# Patient Record
Sex: Male | Born: 1955
Health system: Southern US, Community
[De-identification: ages and names within clinical notes are randomized; demographics above are authoritative.]

## PROBLEM LIST (undated history)

## (undated) DIAGNOSIS — F17200 Nicotine dependence, unspecified, uncomplicated: Secondary | ICD-10-CM

## (undated) DIAGNOSIS — M1990 Unspecified osteoarthritis, unspecified site: Secondary | ICD-10-CM

## (undated) DIAGNOSIS — F1910 Other psychoactive substance abuse, uncomplicated: Secondary | ICD-10-CM

## (undated) HISTORY — DX: Other psychoactive substance abuse, uncomplicated: F19.10

## (undated) HISTORY — DX: Unspecified osteoarthritis, unspecified site: M19.90

---
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
## 2005-03-03 ENCOUNTER — Ambulatory Visit
Admission: RE | Admit: 2005-03-03 | Disposition: A | Discharge status: Home / Self Care | Payer: Self-pay | Source: Ambulatory Visit | Admitting: Family Medicine

## 2017-10-24 ENCOUNTER — Encounter (RURAL_HEALTH_CENTER): Payer: Self-pay | Admitting: Family Medicine

## 2017-10-24 ENCOUNTER — Ambulatory Visit: Payer: Self-pay | Attending: Family Medicine | Admitting: Family Medicine

## 2017-10-24 VITALS — BP 123/73 | HR 60 | Temp 97.6°F | Resp 16 | Ht 71.38 in | Wt 191.4 lb

## 2017-10-24 DIAGNOSIS — H66003 Acute suppurative otitis media without spontaneous rupture of ear drum, bilateral: Secondary | ICD-10-CM

## 2017-10-24 DIAGNOSIS — J069 Acute upper respiratory infection, unspecified: Secondary | ICD-10-CM

## 2017-10-24 MED ORDER — AMOXICILLIN-POT CLAVULANATE 875-125 MG PO TABS
1.00 | ORAL_TABLET | Freq: Two times a day (BID) | ORAL | 0 refills | Status: AC
Start: 2017-10-24 — End: 2017-11-03

## 2017-10-24 NOTE — Progress Notes (Signed)
[facility]  [facility] Acute Visit    History of Presenting Illness:     Chief Complaint   Patient presents with   . Establish Care     pain in both ears and they are stopped up       Douglas Hancock. is a 61 y.o. male who presents to the office with concern for Bilateral otalgia. He tells me that he has had an upper respiratory infection since Sunday, approximately 8 days ago. She reports that his illness began with some nasal congestion as well as a dry nonproductive cough. He tells me that his cough is improving. [place] 4 days ago he has been battling bilateral ear otalgia. He tells me that he believes he may have an ear infection. He tells me he had one back in March and it felt similarly. Patient denies presence of any acute fever, chills, or body aches.  Denies presence of any GI symptoms including nausea, vomiting, abdominal pain, or diarrhea.     Originally, the patient was put on my schedule for an establish care appointment. Although, the patient tells me that he is not interested in any health maintenance. He declines vaccines today including shingles and influenza as well as a referral for screening colonoscopy. The patient tells me that he is in overall, "healthy person". He is not interested in any fasting lab work today to look for possible chronic conditions. He prefers to not go to the doctor if at all possible.     Review of Systems:   ROS  Pertinent positives and negatives as per HPI    Past Medical History:     Past Medical History:   Diagnosis Date   . Arthritis    . Substance abuse        Past Surgical History:     Past Surgical History:   Procedure Laterality Date   . TONSILLECTOMY  1965       Family History:     Family History   Problem Relation Age of Onset   . Cancer Mother    . Cancer Father        Social History:     History   Smoking Status   . Current Every Day Smoker   . Packs/day: 0.50   Smokeless Tobacco   . Never Used     History   Alcohol Use   . Yes      Comment: been sober for 28 years     History   Drug Use     Comment: in the past       Allergies:   No Known Allergies    Medications:     Prior to Admission medications    Medication Sig Start Date End Date Taking? Authorizing Provider   amoxicillin-clavulanate (AUGMENTIN) 875-125 MG per tablet Take 1 tablet by mouth 2 (two) times daily.for 10 days 10/24/17 11/03/17  Philipp Deputy, NP       Physical Exam:     Vitals:    10/24/17 1046   BP: 123/73   Pulse: 60   Resp: 16   Temp: 97.6 F (36.4 C)   SpO2: 100%     Body mass index is 26.41 kg/m.    Physical Exam   Constitutional: He is oriented to person, place, and time. He appears well-developed and well-nourished. No distress.   HENT:   Head: Normocephalic and atraumatic.   Right Ear: Hearing, external ear and ear canal normal.  Tympanic membrane is erythematous. Tympanic membrane is not retracted and not bulging. A middle ear effusion (Purulent) is present.   Left Ear: Hearing, external ear and ear canal normal. Tympanic membrane is erythematous. Tympanic membrane is not retracted and not bulging. A middle ear effusion (Purulent) is present.   Nose: Mucosal edema and rhinorrhea present. Right sinus exhibits no maxillary sinus tenderness and no frontal sinus tenderness. Left sinus exhibits no maxillary sinus tenderness and no frontal sinus tenderness.   Mouth/Throat: Uvula is midline and mucous membranes are normal. Posterior oropharyngeal erythema present. No oropharyngeal exudate or posterior oropharyngeal edema.   Eyes: Pupils are equal, round, and reactive to light. Conjunctivae and EOM are normal.   Wearing glasses.   Neck: Normal range of motion. Neck supple. No thyromegaly present.   Cardiovascular: Normal rate, regular rhythm, S1 normal and S2 normal.    No murmur heard.  Pulmonary/Chest: Effort normal and breath sounds normal. He has no decreased breath sounds. He has no wheezes. He has no rhonchi. He has no rales.   Abdominal: Soft. Bowel sounds are  normal. He exhibits no distension.   Musculoskeletal: Normal range of motion. He exhibits no edema.   Lymphadenopathy:     He has no cervical adenopathy.   Neurological: He is alert and oriented to person, place, and time.   Skin: Skin is warm and dry. No rash noted.   Psychiatric: He has a normal mood and affect. His behavior is normal. Judgment and thought content normal.   Nursing note and vitals reviewed.      Assessment:     1. Acute suppurative otitis media of both ears without spontaneous rupture of tympanic membranes, recurrence not specified     2. URI with cough and congestion         Plan:   1. Augmentin 875-125 mg twice a day 10 days.  2. Also advised patient to start using over-the-counter Flonase daily   3. Push fluids  4. Tylenol and Ibuprofen as needed for fever and pain.  5. Patient is not interested in labs or addressing any health maintenance today.  6. Return to clinic if symptoms persist or worsen.     Signed by Alinda Deem, FNP-C    This note was completed using Dragon Medical speech recognition software. Grammatical errors, random word insertions, pronoun errors and incomplete sentences are occasional consequences of this technology due to software limitations. If there are questions or concerns about the content of this note or information contained within the body of this dictation they should be addressed with the provider for clarification.

## 2017-10-24 NOTE — Patient Instructions (Signed)
Middle Ear Infection (Adult)  You have an infection of the middle ear, the space behind the eardrum. This is also called acute otitis media (AOM). Sometimes it is caused by the common cold. This is because congestion can block the internal passage (eustachian tube) that drains fluid from the middle ear. When the middle ear fills with fluid, bacteria can grow there and cause an infection. Oral antibiotics are used to treat this illness, not ear drops. Symptoms usually start to improve within 1 to 2 days of treatment.    Home care  The following are general care guidelines:   Finish all of the antibiotic medicine given, even though you may feel better after the first few days.   You may use over-the-counter medicine, such as acetaminophen or ibuprofen, to control pain and fever, unless something else was prescribed. If you have chronic liver or kidney disease or have ever had a stomach ulcer or gastrointestinal bleeding, talk with your healthcare provider before using these medicines. Do not give aspirin to anyone under 18 years of age who has a fever. It may cause severe illness or death.  Follow-up care  Follow up with your healthcare provider, or as advised, in 2 weeks if all symptoms have not gotten better, or if hearing doesn't go back to normal within 1 month.  When to seek medical advice  Call your healthcare provider right away if any of these occur:   Ear pain gets worse or does not improve after 3 days of treatment   Unusual drowsiness or confusion   Neck pain, stiff neck, or headache   Fluid or blood draining from the ear canal   Fever of 100.4F (38C) or as advised   Seizure  Date Last Reviewed: 03/26/2015   2000-2016 [employer] [address], [place] [address]. All rights reserved. This information is not intended as a substitute for professional medical care. Always follow your healthcare professional's instructions.

## 2019-07-17 ENCOUNTER — Inpatient Hospital Stay (HOSPITAL_COMMUNITY)
Admission: EM | Admit: 2019-07-17 | Discharge: 2019-07-20 | DRG: 199 | Disposition: A | Discharge status: Home / Self Care | Payer: Self-pay | Attending: Surgery | Admitting: Surgery

## 2019-07-17 ENCOUNTER — Emergency Department (HOSPITAL_COMMUNITY): Payer: Self-pay

## 2019-07-17 ENCOUNTER — Encounter (HOSPITAL_COMMUNITY): Payer: Self-pay | Admitting: *Deleted

## 2019-07-17 ENCOUNTER — Other Ambulatory Visit: Payer: Self-pay

## 2019-07-17 DIAGNOSIS — F10229 Alcohol dependence with intoxication, unspecified: Secondary | ICD-10-CM | POA: Diagnosis present

## 2019-07-17 DIAGNOSIS — Y92009 Unspecified place in unspecified non-institutional (private) residence as the place of occurrence of the external cause: Secondary | ICD-10-CM

## 2019-07-17 DIAGNOSIS — Z20828 Contact with and (suspected) exposure to other viral communicable diseases: Secondary | ICD-10-CM | POA: Diagnosis present

## 2019-07-17 DIAGNOSIS — N400 Enlarged prostate without lower urinary tract symptoms: Secondary | ICD-10-CM | POA: Diagnosis present

## 2019-07-17 DIAGNOSIS — S21112A Laceration without foreign body of left front wall of thorax without penetration into thoracic cavity, initial encounter: Secondary | ICD-10-CM

## 2019-07-17 DIAGNOSIS — S21412A Laceration without foreign body of left back wall of thorax with penetration into thoracic cavity, initial encounter: Secondary | ICD-10-CM | POA: Diagnosis present

## 2019-07-17 DIAGNOSIS — J939 Pneumothorax, unspecified: Secondary | ICD-10-CM

## 2019-07-17 DIAGNOSIS — Z4682 Encounter for fitting and adjustment of non-vascular catheter: Secondary | ICD-10-CM

## 2019-07-17 DIAGNOSIS — I700 Atherosclerosis of aorta: Secondary | ICD-10-CM | POA: Diagnosis present

## 2019-07-17 DIAGNOSIS — S272XXA Traumatic hemopneumothorax, initial encounter: Principal | ICD-10-CM | POA: Diagnosis present

## 2019-07-17 DIAGNOSIS — Y909 Presence of alcohol in blood, level not specified: Secondary | ICD-10-CM | POA: Diagnosis present

## 2019-07-17 DIAGNOSIS — K5790 Diverticulosis of intestine, part unspecified, without perforation or abscess without bleeding: Secondary | ICD-10-CM | POA: Diagnosis present

## 2019-07-17 DIAGNOSIS — J942 Hemothorax: Secondary | ICD-10-CM

## 2019-07-17 LAB — I-STAT CHEM 8, ED
BUN: 18 mg/dL (ref 8–23)
Calcium, Ion: 1.17 mmol/L (ref 1.15–1.40)
Chloride: 106 mmol/L (ref 98–111)
Creatinine, Ser: 1.2 mg/dL (ref 0.61–1.24)
Glucose, Bld: 102 mg/dL — ABNORMAL HIGH (ref 70–99)
HCT: 43 % (ref 39.0–52.0)
Hemoglobin: 14.6 g/dL (ref 13.0–17.0)
Potassium: 3.3 mmol/L — ABNORMAL LOW (ref 3.5–5.1)
Sodium: 141 mmol/L (ref 135–145)
TCO2: 19 mmol/L — ABNORMAL LOW (ref 22–32)

## 2019-07-17 LAB — COMPREHENSIVE METABOLIC PANEL
ALT: 18 U/L (ref 0–44)
AST: 16 U/L (ref 15–41)
Albumin: 3.5 g/dL (ref 3.5–5.0)
Alkaline Phosphatase: 64 U/L (ref 38–126)
Anion gap: 14 (ref 5–15)
BUN: 17 mg/dL (ref 8–23)
CO2: 19 mmol/L — ABNORMAL LOW (ref 22–32)
Calcium: 9.3 mg/dL (ref 8.9–10.3)
Chloride: 106 mmol/L (ref 98–111)
Creatinine, Ser: 1.13 mg/dL (ref 0.61–1.24)
GFR calc Af Amer: >60 mL/min (ref >60)
GFR calc non Af Amer: >60 mL/min (ref >60)
Glucose, Bld: 110 mg/dL — ABNORMAL HIGH (ref 70–99)
Potassium: 3.4 mmol/L — ABNORMAL LOW (ref 3.5–5.1)
Sodium: 139 mmol/L (ref 135–145)
Total Bilirubin: 0.5 mg/dL (ref 0.3–1.2)
Total Protein: 6 g/dL — ABNORMAL LOW (ref 6.5–8.1)

## 2019-07-17 LAB — CBC
HCT: 42.9 % (ref 39.0–52.0)
Hemoglobin: 13.8 g/dL (ref 13.0–17.0)
MCH: 31.8 pg (ref 26.0–34.0)
MCHC: 32.2 g/dL (ref 30.0–36.0)
MCV: 98.8 fL (ref 80.0–100.0)
Platelets: 296 10*3/uL (ref 150–400)
RBC: 4.34 MIL/uL (ref 4.22–5.81)
RDW: 13.2 % (ref 11.5–15.5)
WBC: 14.5 10*3/uL — ABNORMAL HIGH (ref 4.0–10.5)
nRBC: 0 % (ref 0.0–0.2)

## 2019-07-17 LAB — PROTIME-INR
INR: 1.1 (ref 0.8–1.2)
Prothrombin Time: 14.1 seconds (ref 11.4–15.2)

## 2019-07-17 LAB — SAMPLE TO BLOOD BANK

## 2019-07-17 LAB — LACTIC ACID, PLASMA: Lactic Acid, Venous: 4.8 mmol/L (ref 0.5–1.9)

## 2019-07-17 LAB — ETHANOL: Alcohol, Ethyl (B): 168 mg/dL — ABNORMAL HIGH (ref <10)

## 2019-07-17 MED ORDER — FENTANYL CITRATE (PF) 100 MCG/2ML IJ SOLN
100.0000 ug | Freq: Once | INTRAMUSCULAR | Status: DC
  Filled 2019-07-17: qty 2

## 2019-07-17 MED ORDER — HYDRALAZINE HCL 20 MG/ML IJ SOLN: 10.0000 mg | INTRAMUSCULAR | Status: DC | PRN

## 2019-07-17 MED ORDER — PANTOPRAZOLE SODIUM 40 MG IV SOLR: 40.0000 mg | Freq: Every day | INTRAVENOUS | Status: DC

## 2019-07-17 MED ORDER — METHOCARBAMOL 500 MG PO TABS
500.0000 mg | ORAL_TABLET | Freq: Four times a day (QID) | ORAL | Status: DC | PRN
  Administered 2019-07-18 – 2019-07-20 (×3): 500 mg via ORAL
  Filled 2019-07-17 (×3): qty 1

## 2019-07-17 MED ORDER — VITAMIN B-1 100 MG PO TABS
100.0000 mg | ORAL_TABLET | Freq: Every day | ORAL | Status: DC
  Administered 2019-07-18 – 2019-07-20 (×3): 100 mg via ORAL
  Filled 2019-07-17 (×3): qty 1

## 2019-07-17 MED ORDER — ONDANSETRON HCL 4 MG/2ML IJ SOLN: 4.0000 mg | Freq: Four times a day (QID) | INTRAMUSCULAR | Status: DC | PRN

## 2019-07-17 MED ORDER — LORAZEPAM 2 MG/ML IJ SOLN
1.0000 mg | INTRAMUSCULAR | Status: DC | PRN
  Administered 2019-07-18 – 2019-07-19 (×2): 1 mg via INTRAVENOUS
  Filled 2019-07-17 (×2): qty 1

## 2019-07-17 MED ORDER — BISACODYL 10 MG RE SUPP: 10.0000 mg | Freq: Every day | RECTAL | Status: DC | PRN

## 2019-07-17 MED ORDER — SODIUM CHLORIDE 0.9 % IV SOLN
INTRAVENOUS | Status: DC
  Administered 2019-07-17 – 2019-07-18 (×2): via INTRAVENOUS

## 2019-07-17 MED ORDER — IBUPROFEN 200 MG PO TABS
800.0000 mg | ORAL_TABLET | Freq: Three times a day (TID) | ORAL | Status: DC
  Administered 2019-07-18 – 2019-07-20 (×7): 800 mg via ORAL
  Filled 2019-07-17: qty 4
  Filled 2019-07-17: qty 1
  Filled 2019-07-17 (×5): qty 4

## 2019-07-17 MED ORDER — ONDANSETRON 4 MG PO TBDP: 4.0000 mg | ORAL_TABLET | Freq: Four times a day (QID) | ORAL | Status: DC | PRN

## 2019-07-17 MED ORDER — ADULT MULTIVITAMIN W/MINERALS CH
1.0000 | ORAL_TABLET | Freq: Every day | ORAL | Status: DC
  Administered 2019-07-18 – 2019-07-20 (×3): 1 via ORAL
  Filled 2019-07-17 (×3): qty 1

## 2019-07-17 MED ORDER — HYDROMORPHONE HCL 1 MG/ML IJ SOLN
0.5000 mg | INTRAMUSCULAR | Status: DC | PRN
  Administered 2019-07-17 – 2019-07-18 (×2): 0.5 mg via INTRAVENOUS
  Filled 2019-07-17 (×2): qty 1

## 2019-07-17 MED ORDER — GABAPENTIN 300 MG PO CAPS
300.0000 mg | ORAL_CAPSULE | Freq: Three times a day (TID) | ORAL | Status: DC
  Administered 2019-07-18 – 2019-07-20 (×6): 300 mg via ORAL
  Filled 2019-07-17 (×6): qty 1

## 2019-07-17 MED ORDER — DOCUSATE SODIUM 100 MG PO CAPS
100.0000 mg | ORAL_CAPSULE | Freq: Two times a day (BID) | ORAL | Status: DC
  Administered 2019-07-18 – 2019-07-20 (×5): 100 mg via ORAL
  Filled 2019-07-17 (×5): qty 1

## 2019-07-17 MED ORDER — FOLIC ACID 1 MG PO TABS
1.0000 mg | ORAL_TABLET | Freq: Every day | ORAL | Status: DC
  Administered 2019-07-18 – 2019-07-20 (×3): 1 mg via ORAL
  Filled 2019-07-17 (×4): qty 1

## 2019-07-17 MED ORDER — OXYCODONE HCL 5 MG PO TABS
5.0000 mg | ORAL_TABLET | ORAL | Status: DC | PRN
  Administered 2019-07-18: 5 mg via ORAL
  Filled 2019-07-17 (×2): qty 1

## 2019-07-17 MED ORDER — THIAMINE HCL 100 MG/ML IJ SOLN: 100.0000 mg | Freq: Every day | INTRAMUSCULAR | Status: DC

## 2019-07-17 MED ORDER — IOHEXOL 300 MG/ML  SOLN
100.0000 mL | Freq: Once | INTRAMUSCULAR | Status: AC | PRN
  Administered 2019-07-17: 21:00:00 100 mL via INTRAVENOUS

## 2019-07-17 MED ORDER — ACETAMINOPHEN 325 MG PO TABS
650.0000 mg | ORAL_TABLET | Freq: Four times a day (QID) | ORAL | Status: DC
  Administered 2019-07-17 – 2019-07-20 (×8): 650 mg via ORAL
  Filled 2019-07-17 (×8): qty 2

## 2019-07-17 MED ORDER — TETANUS-DIPHTH-ACELL PERTUSSIS 5-2.5-18.5 LF-MCG/0.5 IM SUSP
0.5000 mL | Freq: Once | INTRAMUSCULAR | Status: AC
  Administered 2019-07-17: 0.5 mL via INTRAMUSCULAR
  Filled 2019-07-17: qty 0.5

## 2019-07-17 MED ORDER — PANTOPRAZOLE SODIUM 40 MG PO TBEC
40.0000 mg | DELAYED_RELEASE_TABLET | Freq: Every day | ORAL | Status: DC
  Administered 2019-07-18 – 2019-07-20 (×3): 40 mg via ORAL
  Filled 2019-07-17 (×3): qty 1

## 2019-07-17 MED ORDER — METOPROLOL TARTRATE 5 MG/5ML IV SOLN: 5.0000 mg | Freq: Four times a day (QID) | INTRAVENOUS | Status: DC | PRN

## 2019-07-17 MED ORDER — LORAZEPAM 1 MG PO TABS: 1.0000 mg | ORAL_TABLET | ORAL | Status: DC | PRN

## 2019-07-17 NOTE — ED Notes (Signed)
Pt offered pain medication per orders; pt declined.
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
## 2019-07-18 ENCOUNTER — Observation Stay (HOSPITAL_COMMUNITY): Payer: Self-pay

## 2019-07-18 DIAGNOSIS — F17200 Nicotine dependence, unspecified, uncomplicated: Secondary | ICD-10-CM

## 2019-07-18 HISTORY — DX: Nicotine dependence, unspecified, uncomplicated: F17.200

## 2019-07-18 LAB — HIV ANTIBODY (ROUTINE TESTING W REFLEX): HIV Screen 4th Generation wRfx: NONREACTIVE

## 2019-07-18 LAB — CBC
HCT: 41.3 % (ref 39.0–52.0)
HCT: 43.1 % (ref 39.0–52.0)
Hemoglobin: 13.5 g/dL (ref 13.0–17.0)
Hemoglobin: 14.1 g/dL (ref 13.0–17.0)
MCH: 31.7 pg (ref 26.0–34.0)
MCH: 31.7 pg (ref 26.0–34.0)
MCHC: 32.7 g/dL (ref 30.0–36.0)
MCHC: 32.7 g/dL (ref 30.0–36.0)
MCV: 96.9 fL (ref 80.0–100.0)
MCV: 96.9 fL (ref 80.0–100.0)
Platelets: 266 10*3/uL (ref 150–400)
Platelets: 289 10*3/uL (ref 150–400)
RBC: 4.26 MIL/uL (ref 4.22–5.81)
RBC: 4.45 MIL/uL (ref 4.22–5.81)
RDW: 13.2 % (ref 11.5–15.5)
RDW: 13.4 % (ref 11.5–15.5)
WBC: 17.2 10*3/uL — ABNORMAL HIGH (ref 4.0–10.5)
WBC: 18.5 10*3/uL — ABNORMAL HIGH (ref 4.0–10.5)
nRBC: 0 % (ref 0.0–0.2)
nRBC: 0 % (ref 0.0–0.2)

## 2019-07-18 LAB — COMPREHENSIVE METABOLIC PANEL
ALT: 21 U/L (ref 0–44)
AST: 18 U/L (ref 15–41)
Albumin: 3.9 g/dL (ref 3.5–5.0)
Alkaline Phosphatase: 73 U/L (ref 38–126)
Anion gap: 10 (ref 5–15)
BUN: 18 mg/dL (ref 8–23)
CO2: 22 mmol/L (ref 22–32)
Calcium: 9.8 mg/dL (ref 8.9–10.3)
Chloride: 106 mmol/L (ref 98–111)
Creatinine, Ser: 0.98 mg/dL (ref 0.61–1.24)
GFR calc Af Amer: >60 mL/min (ref >60)
GFR calc non Af Amer: >60 mL/min (ref >60)
Glucose, Bld: 121 mg/dL — ABNORMAL HIGH (ref 70–99)
Potassium: 3.4 mmol/L — ABNORMAL LOW (ref 3.5–5.1)
Sodium: 138 mmol/L (ref 135–145)
Total Bilirubin: 0.5 mg/dL (ref 0.3–1.2)
Total Protein: 6.6 g/dL (ref 6.5–8.1)

## 2019-07-18 LAB — BASIC METABOLIC PANEL
Anion gap: 10 (ref 5–15)
BUN: 17 mg/dL (ref 8–23)
CO2: 23 mmol/L (ref 22–32)
Calcium: 9.4 mg/dL (ref 8.9–10.3)
Chloride: 106 mmol/L (ref 98–111)
Creatinine, Ser: 0.88 mg/dL (ref 0.61–1.24)
GFR calc Af Amer: >60 mL/min (ref >60)
GFR calc non Af Amer: >60 mL/min (ref >60)
Glucose, Bld: 133 mg/dL — ABNORMAL HIGH (ref 70–99)
Potassium: 4.1 mmol/L (ref 3.5–5.1)
Sodium: 139 mmol/L (ref 135–145)

## 2019-07-18 LAB — CDS SEROLOGY

## 2019-07-18 LAB — URINALYSIS, ROUTINE W REFLEX MICROSCOPIC
Bilirubin Urine: NEGATIVE
Glucose, UA: NEGATIVE mg/dL
Hgb urine dipstick: NEGATIVE
Ketones, ur: 5 mg/dL — AB
Leukocytes,Ua: NEGATIVE
Nitrite: NEGATIVE
Protein, ur: NEGATIVE mg/dL
Specific Gravity, Urine: >1.046 — ABNORMAL HIGH (ref 1.005–1.030)
pH: 5 (ref 5.0–8.0)

## 2019-07-18 LAB — PHOSPHORUS: Phosphorus: 2.7 mg/dL (ref 2.5–4.6)

## 2019-07-18 LAB — SARS CORONAVIRUS 2 (TAT 6-24 HRS): SARS Coronavirus 2: NEGATIVE

## 2019-07-18 LAB — MAGNESIUM: Magnesium: 2 mg/dL (ref 1.7–2.4)

## 2019-07-18 MED ORDER — OXYCODONE HCL 5 MG PO TABS
5.0000 mg | ORAL_TABLET | ORAL | Status: DC | PRN
  Administered 2019-07-18: 10 mg via ORAL
  Administered 2019-07-19: 5 mg via ORAL
  Administered 2019-07-20 (×2): 10 mg via ORAL
  Filled 2019-07-18: qty 2
  Filled 2019-07-18: qty 1
  Filled 2019-07-18 (×2): qty 2
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
## 2019-07-18 NOTE — ED Notes (Signed)
MS ordered bfast 

## 2019-07-18 NOTE — ED Notes (Signed)
Pt c/o difficulty taking a deep breath and increased pain in L side of his chest that has progressively gotten worse. VS remain the same with no significant changes, sats 95%
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
## 2019-07-19 ENCOUNTER — Inpatient Hospital Stay (HOSPITAL_COMMUNITY): Payer: Self-pay

## 2019-07-19 ENCOUNTER — Encounter (HOSPITAL_COMMUNITY): Payer: Self-pay | Admitting: *Deleted

## 2019-07-19 LAB — CBC
HCT: 35 % — ABNORMAL LOW (ref 39.0–52.0)
Hemoglobin: 11.6 g/dL — ABNORMAL LOW (ref 13.0–17.0)
MCH: 31.6 pg (ref 26.0–34.0)
MCHC: 33.1 g/dL (ref 30.0–36.0)
MCV: 95.4 fL (ref 80.0–100.0)
Platelets: 229 10*3/uL (ref 150–400)
RBC: 3.67 MIL/uL — ABNORMAL LOW (ref 4.22–5.81)
RDW: 13.3 % (ref 11.5–15.5)
WBC: 11.2 10*3/uL — ABNORMAL HIGH (ref 4.0–10.5)
nRBC: 0 % (ref 0.0–0.2)

## 2019-07-19 MED ORDER — ENOXAPARIN SODIUM 40 MG/0.4ML ~~LOC~~ SOLN
40.0000 mg | SUBCUTANEOUS | Status: DC
  Administered 2019-07-19 – 2019-07-20 (×2): 40 mg via SUBCUTANEOUS
  Filled 2019-07-19 (×2): qty 0.4
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
## 2019-07-19 NOTE — Plan of Care (Signed)
  Problem: Coping: Goal: Level of anxiety will decrease Outcome: Progressing   Problem: Clinical Measurements: Goal: Respiratory complications will improve Outcome: Progressing

## 2019-07-20 ENCOUNTER — Inpatient Hospital Stay (HOSPITAL_COMMUNITY): Payer: Self-pay

## 2019-07-20 MED ORDER — METHOCARBAMOL 500 MG PO TABS: 500.0000 mg | ORAL_TABLET | Freq: Four times a day (QID) | ORAL | 0 refills | Status: DC | PRN

## 2019-07-20 MED ORDER — OXYCODONE HCL 5 MG PO TABS: 5.0000 mg | ORAL_TABLET | Freq: Four times a day (QID) | ORAL | 0 refills | Status: DC | PRN

## 2019-07-20 NOTE — Plan of Care (Addendum)
  Problem: Clinical Measurements: Goal: Respiratory complications will improve Outcome: Progressing   Problem: Clinical Measurements: Goal: Ability to maintain clinical measurements within normal limits will improve Outcome: Progressing  Back dsg changed.  Mid Back area noted swollen, slightly red, scant amount serosanguinous drainage noted.  L flank chest tube dsg is C/D/I. Reinforced with hypofix tape. No drainage noted at the chest tube entry site.

## 2019-07-20 NOTE — Progress Notes (Signed)
A discharge packet printed and reviewed.  The patient understands discharge instructions regarding obtaining a repeat chest xray before his follow up.   Dressing to the left chest dry and intact.  Dressing to the upper left back dry and intact.  No respiratory distress noted.

## 2019-07-20 NOTE — Discharge Instructions (Signed)
PNEUMOTHORAX/HEMOTHORAX HOME INSTRUCTIONS   1. PAIN CONTROL:  1. Pain is best controlled by a usual combination of three different methods TOGETHER:  i. Ice/Heat ii. Over the counter pain medication iii. Prescription pain medication 2. You may experience some swelling and bruising in area of broken ribs. Ice packs or heating pads (30-60 minutes up to 6 times a day) will help. Use ice for the first few days to help decrease swelling and bruising, then switch to heat to help relax tight/sore spots and speed recovery. Some people prefer to use ice alone, heat alone, alternating between ice & heat. Experiment to what works for you. Swelling and bruising can take several weeks to resolve.  3. It is helpful to take an over-the-counter pain medication regularly for the first few weeks. Choose one of the following that works best for you:  i. Naproxen (Aleve, etc) Two  tabs twice a day ii. Ibuprofen (Advil, etc) Three  tabs four times a day (every meal & bedtime) iii. Acetaminophen (Tylenol, etc) 500-650mg  four times a day (every meal & bedtime) 4. A prescription for pain medication (such as oxycodone, hydrocodone, etc) may be given to you upon discharge. Take your pain medication as prescribed. DO NOT DRIVE WHILE TAKING NARCOTIC PAIN MEDICATION i. If you are having problems/concerns with the prescription medicine (does not control pain, nausea, vomiting, rash, itching, etc), please call us [phone] to see if we need to switch you to a different pain medicine that will work better for you and/or control your side effect better. ii. If you need a refill on your pain medication, please contact your pharmacy. They will contact our office to request authorization. Prescriptions will not be filled after 5 pm or on week-ends. 1. Avoid getting constipated. When taking pain medications, it is common to experience some constipation. Increasing fluid intake and taking a fiber supplement (such as  Metamucil, Citrucel, FiberCon, MiraLax, etc) 1-2 times a day regularly will usually help prevent this problem from occurring. A mild laxative (prune juice, Milk of Magnesia, MiraLax, etc) should be taken according to package directions if there are no bowel movements after 48 hours.  2. Watch out for diarrhea. If you have many loose bowel movements, simplify your diet to bland foods & liquids for a few days. Stop any stool softeners and decrease your fiber supplement. Switching to mild anti-diarrheal medications (Kayopectate, Pepto Bismol) can help. If this worsens or does not improve, please call us. 3. Chest tube site wound: you may remove the dressing from your chest tube site 3 days after the removal of your chest tube. DO NOT shower over the dressing. Once   removed, you may shower as normal. Do not submerge your wound in water for 2-3 weeks.  4. FOLLOW UP  a. Please call our office to set up or confirm an appointment for follow up for 2 weeks after discharge. You will need to get a chest xray at either [facility] or [facility]. This will be outlined in your follow up instructions. Please call [facility] at [phone] if you have any questions about follow up.  b. If you have any orthopedic or other injuries you will need to follow up as outlined in your follow up instructions.   WHEN TO CALL us [phone]:  1. Poor pain control 2. Reactions / problems with new medications (rash/itching, nausea, etc)  3. Fever over 101.5 F (38.5 C) 4. Worsening swelling or bruising 5. Redness, drainage, pain or swelling around  chest tube site 6. Worsening pain, productive cough, difficulty breathing or any other concerning symptoms  The clinic staff is available to answer your questions during regular business hours (8:30am-5pm). Please dont hesitate to call and ask to speak to one of our nurses for clinical concerns.  If you have a medical emergency, go to the nearest emergency room or call 911.    [facility] always [facility] at [facility]   [facility] [place]  [address], [address], [place], [place] [address] ?  MAIN: [phone] ? TOLL FREE: [phone] ?  FAX [phone]  [email or web address]      Information on Rib Fractures  A rib fracture is a break or crack in one of the bones of the ribs. The ribs are long, curved bones that wrap around your chest and attach to your spine and your breastbone. The ribs protect your heart, lungs, and other organs in the chest. A broken or cracked rib is often painful but is not usually serious. Most rib fractures heal on their own over time. However, rib fractures can be more serious if multiple ribs are broken or if broken ribs move out of place and push against other structures or organs. What are the causes? This condition is caused by:  Repetitive movements with high force, such as pitching a baseball or having severe coughing spells.  A direct blow to the chest, such as a sports injury, a car accident, or a fall.  Cancer that has spread to the bones, which can weaken bones and cause them to break. What are the signs or symptoms? Symptoms of this condition include:  Pain when you breathe in or cough.  Pain when someone presses on the injured area.  Feeling short of breath. How is this diagnosed? This condition is diagnosed with a physical exam and medical history. Imaging tests may also be done, such as:  Chest X-ray.  CT scan.  MRI.  Bone scan.  Chest ultrasound. How is this treated? Treatment for this condition depends on the severity of the fracture. Most rib fractures usually heal on their own in 1-3 months. Sometimes healing takes longer if there is a cough that does not stop or if there are other activities that make the injury worse (aggravating factors). While you heal, you will be given medicines to control the pain. You will also be taught  deep breathing exercises. Severe injuries may require hospitalization or surgery. Follow these instructions at home: Managing pain, stiffness, and swelling  If directed, apply ice to the injured area. ? Put ice in a plastic bag. ? Place a towel between your skin and the bag. ? Leave the ice on for 20 minutes, 2-3 times a day.  Take over-the-counter and prescription medicines only as told by your health care provider. Activity  Avoid a lot of activity and any activities or movements that cause pain. Be careful during activities and avoid bumping the injured rib.  Slowly increase your activity as told by your health care provider. General instructions  Do deep breathing exercises as told by your health care provider. This helps prevent pneumonia, which is a common complication of a broken rib. Your health care provider may instruct you to: ? Take deep breaths several times a day. ? Try to cough several times a day, holding a pillow against the injured area. ? Use a device called incentive spirometer to practice deep breathing several times a day.  Drink enough fluid to keep your urine pale yellow.  Do not wear a rib belt or binder. These restrict breathing, which can lead to pneumonia.  Keep all follow-up visits as told by your health care provider. This is important. Contact a health care provider if:  You have a fever. Get help right away if:  You have difficulty breathing or you are short of breath.  You develop a cough that does not stop, or you cough up thick or bloody sputum.  You have nausea, vomiting, or pain in your abdomen.  Your pain gets worse and medicine does not help. Summary  A rib fracture is a break or crack in one of the bones of the ribs.  A broken or cracked rib is often painful but is not usually serious.  Most rib fractures heal on their own over time.  Treatment for this condition depends on the severity of the fracture.  Avoid a lot of activity  and any activities or movements that cause pain. This information is not intended to replace advice given to you by your health care provider. Make sure you discuss any questions you have with your health care provider. Document Released: 10/11/2005 Document Revised: 01/10/2017 Document Reviewed: 01/10/2017 Elsevier Interactive Patient Education  2019 [employer]    Pneumothorax A pneumothorax is commonly called a collapsed lung. It is a condition in which air leaks from a lung and builds up between the thin layer of tissue that covers the lungs (visceral pleura) and the interior wall of the chest cavity (parietal pleura). The air gets trapped outside the lung, between the lung and the chest wall (pleural space). The air takes up space and prevents the lung from fully expanding. This condition sometimes occurs suddenly with no apparent cause. The buildup of air may be small or large. A small pneumothorax may go away on its own. A large pneumothorax will require treatment and hospitalization. What are the causes? This condition may be caused by:  Trauma and injury to the chest wall.  Surgery and other medical procedures.  A complication of an underlying lung problem, especially chronic obstructive pulmonary disease (COPD) or emphysema. Sometimes the cause of this condition is not known. What increases the risk? You are more likely to develop this condition if:  You have an underlying lung problem.  You smoke.  You are 57-71 years old, male, tall, and underweight.  You have a personal or family history of pneumothorax.  You have an eating disorder (anorexia nervosa). This condition can also happen quickly, even in people with no history of lung problems. What are the signs or symptoms? Sometimes a pneumothorax will have no symptoms. When symptoms are present, they can include:  Chest pain.  Shortness of breath.  Increased rate of breathing.  Bluish color to your lips or skin  (cyanosis). How is this diagnosed? This condition may be diagnosed by:  A medical history and physical exam.  A chest X-ray, chest CT scan, or ultrasound. How is this treated? Treatment depends on how severe your condition is. The goal of treatment is to remove the extra air and allow your lung to expand back to its normal size.  For a small pneumothorax: ? No treatment may be needed. ? Extra oxygen is sometimes used to make it go away more quickly.  For a large pneumothorax or a pneumothorax that is causing symptoms, a procedure is done to drain the air from your lungs. To do this, a health care  provider may use: ? A needle with a syringe. This is used to suck air from a pleural space where no additional leakage is taking place. ? A chest tube. This is used to suck air where there is ongoing leakage into the pleural space. The chest tube may need to remain in place for several days until the air leak has healed.  In more severe cases, surgery may be needed to repair the damage that is causing the leak.  If you have multiple pneumothorax episodes or have an air leak that will not heal, a procedure called a pleurodesis may be done. A medicine is placed in the pleural space to irritate the tissues around the lung so that the lung will stick to the chest wall, seal any leaks, and stop any buildup of air in that space. If you have an underlying lung problem, severe symptoms, or a large pneumothorax you will usually need to stay in the hospital. Follow these instructions at home: Lifestyle  Do not use any products that contain nicotine or tobacco, such as cigarettes and e-cigarettes. These are major risk factors in pneumothorax. If you need help quitting, ask your health care provider.  Do not lift anything that is heavier than 10 lb (4.5 kg), or the limit that your health care provider tells you, until he or she says that it is safe.  Avoid activities that take a lot of effort (strenuous)  for as long as told by your health care provider.  Return to your normal activities as told by your health care provider. Ask your health care provider what activities are safe for you.  Do not fly in an airplane or scuba dive until your health care provider says it is okay. General instructions  Take over-the-counter and prescription medicines only as told by your health care provider.  If a cough or pain makes it difficult for you to sleep at night, try sleeping in a semi-upright position in a recliner or by using 2 or 3 pillows.  If you had a chest tube and it was removed, ask your health care provider when you can remove the bandage (dressing). While the dressing is in place, do not allow it to get wet.  Keep all follow-up visits as told by your health care provider. This is important. Contact a health care provider if:  You cough up thick mucus (sputum) that is yellow or green in color.  You were treated with a chest tube, and you have redness, increasing pain, or discharge at the site where it was placed. Get help right away if:  You have increasing chest pain or shortness of breath.  You have a cough that will not go away.  You begin coughing up blood.  You have pain that is getting worse or is not controlled with medicines.  The site where your chest tube was located opens up.  You feel air coming out of the site where the chest tube was placed.  You have a fever or persistent symptoms for more than 2-3 days.  You have a fever and your symptoms suddenly get worse. These symptoms may represent a serious problem that is an emergency. Do not wait to see if the symptoms will go away. Get medical help right away. Call your local emergency services (911 in the [place] Do not drive yourself to the hospital. Summary  A pneumothorax, commonly called a collapsed lung, is a condition in which air leaks from a lung and gets trapped  between the lung and the chest wall (pleural  space).  The buildup of air may be small or large. A small pneumothorax may go away on its own. A large pneumothorax will require treatment and hospitalization.  Treatment for this condition depends on how severe the pneumothorax is. The goal of treatment is to remove the extra air and allow the lung to expand back to its normal size. This information is not intended to replace advice given to you by your health care provider. Make sure you discuss any questions you have with your health care provider. Document Released: 10/11/2005 Document Revised: 09/19/2017 Document Reviewed: 09/19/2017 Elsevier Interactive Patient Education  2019 [employer].
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
## 2019-08-08 ENCOUNTER — Ambulatory Visit
Admission: RE | Admit: 2019-08-08 | Discharge: 2019-08-08 | Disposition: A | Discharge status: Home / Self Care | Payer: Self-pay | Source: Ambulatory Visit | Attending: Physician Assistant | Admitting: Physician Assistant

## 2019-08-08 ENCOUNTER — Other Ambulatory Visit: Payer: Self-pay | Admitting: Physician Assistant

## 2019-08-08 DIAGNOSIS — J942 Hemothorax: Secondary | ICD-10-CM

## 2019-08-09 ENCOUNTER — Other Ambulatory Visit (HOSPITAL_COMMUNITY): Payer: Self-pay | Admitting: Surgery

## 2019-08-09 ENCOUNTER — Other Ambulatory Visit: Payer: Self-pay | Admitting: Surgery

## 2019-08-16 ENCOUNTER — Other Ambulatory Visit: Payer: Self-pay | Admitting: Surgery

## 2019-08-16 ENCOUNTER — Other Ambulatory Visit (HOSPITAL_COMMUNITY): Payer: Self-pay | Admitting: Surgery

## 2019-08-16 DIAGNOSIS — J942 Hemothorax: Secondary | ICD-10-CM

## 2019-08-17 ENCOUNTER — Other Ambulatory Visit (HOSPITAL_COMMUNITY): Payer: Self-pay

## 2019-08-27 ENCOUNTER — Other Ambulatory Visit (HOSPITAL_COMMUNITY)
Admission: RE | Admit: 2019-08-27 | Discharge: 2019-08-27 | Disposition: A | Discharge status: Home / Self Care | Payer: Self-pay | Source: Ambulatory Visit | Attending: Surgery | Admitting: Surgery

## 2019-08-27 DIAGNOSIS — Z01812 Encounter for preprocedural laboratory examination: Secondary | ICD-10-CM | POA: Insufficient documentation

## 2019-08-27 DIAGNOSIS — Z20828 Contact with and (suspected) exposure to other viral communicable diseases: Secondary | ICD-10-CM | POA: Insufficient documentation

## 2019-08-27 LAB — SARS CORONAVIRUS 2 (TAT 6-24 HRS): SARS Coronavirus 2: NEGATIVE

## 2019-08-29 ENCOUNTER — Ambulatory Visit (HOSPITAL_COMMUNITY)
Admission: RE | Admit: 2019-08-29 | Discharge: 2019-08-29 | Disposition: A | Discharge status: Home / Self Care | Payer: Self-pay | Source: Ambulatory Visit | Attending: Surgery | Admitting: Surgery

## 2019-08-29 ENCOUNTER — Encounter (HOSPITAL_COMMUNITY): Payer: Self-pay | Admitting: Student

## 2019-08-29 ENCOUNTER — Other Ambulatory Visit (HOSPITAL_COMMUNITY): Payer: Self-pay | Admitting: Surgery

## 2019-08-29 ENCOUNTER — Other Ambulatory Visit: Payer: Self-pay

## 2019-08-29 DIAGNOSIS — J942 Hemothorax: Secondary | ICD-10-CM

## 2019-08-29 HISTORY — PX: IR THORACENTESIS ASP PLEURAL SPACE W/IMG GUIDE: IMG5380

## 2019-08-29 MED ORDER — LIDOCAINE HCL 1 % IJ SOLN
INTRAMUSCULAR | Status: AC
  Filled 2019-08-29: qty 20

## 2019-08-29 MED ORDER — LIDOCAINE HCL 1 % IJ SOLN
INTRAMUSCULAR | Status: AC | PRN
  Administered 2019-08-29: 10 mL

## 2019-08-29 NOTE — Procedures (Signed)
PROCEDURE SUMMARY:  Successful US guided therapeutic left thoracentesis. Yielded 700 mL of amber fluid. Pt tolerated procedure well. No immediate complications.  Specimen was not sent for labs. CXR ordered.  EBL < 5 mL  Docia Barrier PA-C 08/29/2019 2:15 PM

## 2019-09-11 ENCOUNTER — Ambulatory Visit (HOSPITAL_COMMUNITY)
Admission: RE | Admit: 2019-09-11 | Discharge: 2019-09-11 | Disposition: A | Discharge status: Home / Self Care | Payer: Self-pay | Source: Ambulatory Visit | Attending: General Surgery | Admitting: General Surgery

## 2019-09-11 ENCOUNTER — Other Ambulatory Visit (HOSPITAL_COMMUNITY): Payer: Self-pay | Admitting: General Surgery

## 2019-09-11 ENCOUNTER — Other Ambulatory Visit: Payer: Self-pay

## 2019-09-11 DIAGNOSIS — J942 Hemothorax: Secondary | ICD-10-CM

## 2019-10-03 ENCOUNTER — Ambulatory Visit (HOSPITAL_COMMUNITY)
Admission: RE | Admit: 2019-10-03 | Discharge: 2019-10-03 | Disposition: A | Discharge status: Home / Self Care | Payer: Self-pay | Source: Ambulatory Visit | Attending: Surgery | Admitting: Surgery

## 2019-10-03 ENCOUNTER — Other Ambulatory Visit: Payer: Self-pay

## 2019-10-03 ENCOUNTER — Other Ambulatory Visit (HOSPITAL_COMMUNITY): Payer: Self-pay | Admitting: Surgery

## 2019-10-03 DIAGNOSIS — J942 Hemothorax: Secondary | ICD-10-CM
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
## 2020-04-28 ENCOUNTER — Other Ambulatory Visit: Payer: Self-pay

## 2020-04-28 ENCOUNTER — Inpatient Hospital Stay (HOSPITAL_COMMUNITY): Payer: Self-pay

## 2020-04-28 ENCOUNTER — Emergency Department (HOSPITAL_COMMUNITY): Payer: Self-pay

## 2020-04-28 ENCOUNTER — Encounter (HOSPITAL_COMMUNITY): Payer: Self-pay | Admitting: Emergency Medicine

## 2020-04-28 ENCOUNTER — Encounter (HOSPITAL_COMMUNITY): Payer: Self-pay

## 2020-04-28 ENCOUNTER — Inpatient Hospital Stay (HOSPITAL_COMMUNITY)
Admission: EM | Admit: 2020-04-28 | Discharge: 2020-04-30 | DRG: 244 | Disposition: A | Discharge status: Home / Self Care | Payer: Self-pay | Attending: Cardiology | Admitting: Cardiology

## 2020-04-28 ENCOUNTER — Ambulatory Visit (HOSPITAL_COMMUNITY)
Admission: EM | Admit: 2020-04-28 | Discharge: 2020-04-28 | Disposition: A | Discharge status: Nursing Home (Includes ICF) | Payer: Self-pay

## 2020-04-28 DIAGNOSIS — Z959 Presence of cardiac and vascular implant and graft, unspecified: Secondary | ICD-10-CM

## 2020-04-28 DIAGNOSIS — I453 Trifascicular block: Secondary | ICD-10-CM | POA: Diagnosis present

## 2020-04-28 DIAGNOSIS — Z8249 Family history of ischemic heart disease and other diseases of the circulatory system: Secondary | ICD-10-CM

## 2020-04-28 DIAGNOSIS — R05 Cough: Secondary | ICD-10-CM

## 2020-04-28 DIAGNOSIS — I361 Nonrheumatic tricuspid (valve) insufficiency: Secondary | ICD-10-CM

## 2020-04-28 DIAGNOSIS — R0602 Shortness of breath: Secondary | ICD-10-CM

## 2020-04-28 DIAGNOSIS — Z7982 Long term (current) use of aspirin: Secondary | ICD-10-CM

## 2020-04-28 DIAGNOSIS — Z20822 Contact with and (suspected) exposure to covid-19: Secondary | ICD-10-CM | POA: Diagnosis present

## 2020-04-28 DIAGNOSIS — F17210 Nicotine dependence, cigarettes, uncomplicated: Secondary | ICD-10-CM | POA: Diagnosis present

## 2020-04-28 DIAGNOSIS — I459 Conduction disorder, unspecified: Secondary | ICD-10-CM

## 2020-04-28 DIAGNOSIS — I442 Atrioventricular block, complete: Secondary | ICD-10-CM | POA: Diagnosis present

## 2020-04-28 DIAGNOSIS — I441 Atrioventricular block, second degree: Principal | ICD-10-CM | POA: Diagnosis present

## 2020-04-28 LAB — CBC WITH DIFFERENTIAL/PLATELET
Abs Immature Granulocytes: 0.05 10*3/uL (ref 0.00–0.07)
Basophils Absolute: 0 10*3/uL (ref 0.0–0.1)
Basophils Relative: 0 %
Eosinophils Absolute: 0.2 10*3/uL (ref 0.0–0.5)
Eosinophils Relative: 2 %
HCT: 45.2 % (ref 39.0–52.0)
Hemoglobin: 14.8 g/dL (ref 13.0–17.0)
Immature Granulocytes: 0 %
Lymphocytes Relative: 25 %
Lymphs Abs: 2.8 10*3/uL (ref 0.7–4.0)
MCH: 32 pg (ref 26.0–34.0)
MCHC: 32.7 g/dL (ref 30.0–36.0)
MCV: 97.8 fL (ref 80.0–100.0)
Monocytes Absolute: 1 10*3/uL (ref 0.1–1.0)
Monocytes Relative: 9 %
Neutro Abs: 7.2 10*3/uL (ref 1.7–7.7)
Neutrophils Relative %: 64 %
Platelets: 241 10*3/uL (ref 150–400)
RBC: 4.62 MIL/uL (ref 4.22–5.81)
RDW: 14.5 % (ref 11.5–15.5)
WBC: 11.2 10*3/uL — ABNORMAL HIGH (ref 4.0–10.5)
nRBC: 0 % (ref 0.0–0.2)

## 2020-04-28 LAB — ECHOCARDIOGRAM COMPLETE
Height: 70 in
Weight: 2960 oz

## 2020-04-28 LAB — CREATININE, SERUM
Creatinine, Ser: 1.08 mg/dL (ref 0.61–1.24)
GFR calc Af Amer: >60 mL/min (ref >60)
GFR calc non Af Amer: >60 mL/min (ref >60)

## 2020-04-28 LAB — MAGNESIUM
Magnesium: 2.2 mg/dL (ref 1.7–2.4)
Magnesium: 2 mg/dL (ref 1.7–2.4)

## 2020-04-28 LAB — COMPREHENSIVE METABOLIC PANEL
ALT: 60 U/L — ABNORMAL HIGH (ref 0–44)
AST: 29 U/L (ref 15–41)
Albumin: 3.3 g/dL — ABNORMAL LOW (ref 3.5–5.0)
Alkaline Phosphatase: 76 U/L (ref 38–126)
Anion gap: 10 (ref 5–15)
BUN: 19 mg/dL (ref 8–23)
CO2: 21 mmol/L — ABNORMAL LOW (ref 22–32)
Calcium: 9.4 mg/dL (ref 8.9–10.3)
Chloride: 108 mmol/L (ref 98–111)
Creatinine, Ser: 1.07 mg/dL (ref 0.61–1.24)
GFR calc Af Amer: >60 mL/min (ref >60)
GFR calc non Af Amer: >60 mL/min (ref >60)
Glucose, Bld: 87 mg/dL (ref 70–99)
Potassium: 4.9 mmol/L (ref 3.5–5.1)
Sodium: 139 mmol/L (ref 135–145)
Total Bilirubin: 1.6 mg/dL — ABNORMAL HIGH (ref 0.3–1.2)
Total Protein: 6 g/dL — ABNORMAL LOW (ref 6.5–8.1)

## 2020-04-28 LAB — CBC
HCT: 43.8 % (ref 39.0–52.0)
Hemoglobin: 14.7 g/dL (ref 13.0–17.0)
MCH: 32 pg (ref 26.0–34.0)
MCHC: 33.6 g/dL (ref 30.0–36.0)
MCV: 95.2 fL (ref 80.0–100.0)
Platelets: 222 10*3/uL (ref 150–400)
RBC: 4.6 MIL/uL (ref 4.22–5.81)
RDW: 14.4 % (ref 11.5–15.5)
WBC: 9.9 10*3/uL (ref 4.0–10.5)
nRBC: 0 % (ref 0.0–0.2)

## 2020-04-28 LAB — HEMOGLOBIN A1C
Hgb A1c MFr Bld: 5.6 % (ref 4.8–5.6)
Mean Plasma Glucose: 114.02 mg/dL

## 2020-04-28 LAB — TSH
TSH: 1.733 u[IU]/mL (ref 0.350–4.500)
TSH: 1.621 u[IU]/mL (ref 0.350–4.500)

## 2020-04-28 LAB — SARS CORONAVIRUS 2 BY RT PCR (HOSPITAL ORDER, PERFORMED IN ~~LOC~~ HOSPITAL LAB): SARS Coronavirus 2: NEGATIVE

## 2020-04-28 LAB — TROPONIN I (HIGH SENSITIVITY)
Troponin I (High Sensitivity): 18 ng/L — ABNORMAL HIGH (ref <18)
Troponin I (High Sensitivity): 16 ng/L (ref <18)

## 2020-04-28 MED ORDER — METHOCARBAMOL 500 MG PO TABS: 500.0000 mg | ORAL_TABLET | Freq: Four times a day (QID) | ORAL | Status: DC | PRN

## 2020-04-28 MED ORDER — SODIUM CHLORIDE 0.9% FLUSH
3.0000 mL | Freq: Two times a day (BID) | INTRAVENOUS | Status: DC
  Administered 2020-04-28 – 2020-04-29 (×2): 3 mL via INTRAVENOUS

## 2020-04-28 MED ORDER — HEPARIN SODIUM (PORCINE) 5000 UNIT/ML IJ SOLN
5000.0000 [IU] | Freq: Three times a day (TID) | INTRAMUSCULAR | Status: DC
  Administered 2020-04-28 – 2020-04-29 (×2): 5000 [IU] via SUBCUTANEOUS
  Filled 2020-04-28 (×3): qty 1

## 2020-04-28 MED ORDER — SODIUM CHLORIDE 0.9% FLUSH: 3.0000 mL | INTRAVENOUS | Status: DC | PRN

## 2020-04-28 MED ORDER — ACETAMINOPHEN 325 MG PO TABS
650.0000 mg | ORAL_TABLET | ORAL | Status: DC | PRN
  Administered 2020-04-28: 650 mg via ORAL
  Filled 2020-04-28: qty 2

## 2020-04-28 MED ORDER — ONDANSETRON HCL 4 MG/2ML IJ SOLN: 4.0000 mg | Freq: Four times a day (QID) | INTRAMUSCULAR | Status: DC | PRN

## 2020-04-28 MED ORDER — ALPRAZOLAM 0.25 MG PO TABS: 0.2500 mg | ORAL_TABLET | Freq: Two times a day (BID) | ORAL | Status: DC | PRN

## 2020-04-28 MED ORDER — SODIUM CHLORIDE 0.9 % IV SOLN: 250.0000 mL | INTRAVENOUS | Status: DC | PRN

## 2020-04-28 MED ORDER — DOBUTAMINE IN D5W 4-5 MG/ML-% IV SOLN
2.5000 ug/kg/min | INTRAVENOUS | Status: DC
  Administered 2020-04-28: 5 ug/kg/min via INTRAVENOUS
  Administered 2020-04-29: 17.5 ug/kg/min via INTRAVENOUS
  Filled 2020-04-28 (×2): qty 250

## 2020-04-28 MED ORDER — ASPIRIN EC 325 MG PO TBEC
325.0000 mg | DELAYED_RELEASE_TABLET | Freq: Once | ORAL | Status: AC
  Administered 2020-04-28: 325 mg via ORAL
  Filled 2020-04-28: qty 1

## 2020-04-28 MED ORDER — ORAL CARE MOUTH RINSE
15.0000 mL | Freq: Two times a day (BID) | OROMUCOSAL | Status: DC
  Administered 2020-04-28 – 2020-04-29 (×2): 15 mL via OROMUCOSAL

## 2020-04-28 MED ORDER — ZOLPIDEM TARTRATE 5 MG PO TABS: 5.0000 mg | ORAL_TABLET | Freq: Every evening | ORAL | Status: DC | PRN

## 2020-04-28 MED ORDER — SODIUM CHLORIDE 0.9 % IV SOLN: INTRAVENOUS | Status: DC
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
## 2020-04-28 NOTE — ED Triage Notes (Signed)
Pt c/o productive cough with clear sputum, SOB, dizziness described as "off balance", abdominal bloating, loss of appetite, for approx 1 week.   Denies CP, fever, chills, congestion, ear pain, n/v/d.   Has been taking mucinex with some improvement.  Bilat lung sounds diminished with slightly coarse sounds, wheeze on right.  States mother/father h/o MI; father first MI at approx 39years old.

## 2020-04-28 NOTE — ED Notes (Signed)
Paged cards per [facility] ([place]

## 2020-04-28 NOTE — Progress Notes (Signed)
Attempted echo.  Heading to 2H per nurse.

## 2020-04-28 NOTE — Progress Notes (Signed)
  Echocardiogram 2D Echocardiogram has been performed.  Celene Skeen 04/28/2020, 4:21 PM

## 2020-04-28 NOTE — ED Triage Notes (Signed)
Pt arrives via [facility] from [facility] where pt was seen for SOb and dizziness x 1 week. EMS reports pt  found to be in 3rd degree heart block, HR 22-24.  Pt AOx4, denies pain, endorses mild SOB.

## 2020-04-28 NOTE — Progress Notes (Addendum)
Cardiology Admission History and Physical:   Patient ID: Luis Flowers. MRN: [id number]; DOB: June 16, 1956   Admission date: 04/28/2020  Primary Care Provider: Patient, No Pcp Per [facility] Cardiologist: [facility] Electrophysiologist:  None   Chief Complaint:  Fatigue, dizziness  Patient Profile:   Luis Flowers. is a 64 y.o. male with history of tobacco abuse presents with 1 week of dizziness, fatigue, and DOE  History of Present Illness:   [name] 64 yo male no prior cardiac history presents with 1 week of significant dizziness/lightheadeness, fatigue, and DOE. No chest pains. No LE edema. In ER found to be severely bradycardic to the 20s, EKG showed 2:1 and 3:1 AV block. Cardiology consulted. He is not on any av nodal agents at home. BP in ER is stable.    WBC 11.2 Hgb 14.8 Plt 241 CMET pending TSH pending EKG 2:1 and 2:1 AV block, LAFB, RBBB  Past Medical History:  Diagnosis Date  . Smoker 07/18/2019   pt current smoker 1ppd    Past Surgical History:  Procedure Laterality Date  . IR THORACENTESIS ASP PLEURAL SPACE W/IMG GUIDE  08/29/2019     Medications Prior to Admission: Prior to Admission medications   Medication Sig Start Date End Date Taking? Authorizing Provider  aspirin 325 MG EC tablet Take 650 mg by mouth 3 (three) times daily.    Yes [provider]  methocarbamol (ROBAXIN) 500 MG tablet Take 1 tablet (500 mg total) by mouth every 6 (six) hours as needed for muscle spasms. Patient not taking: Reported on 04/28/2020 07/20/19   Maczis, Elmer Sow, PA-C  oxyCODONE (OXY IR/ROXICODONE) 5 MG immediate release tablet Take 1 tablet (5 mg total) by mouth every 6 (six) hours as needed for breakthrough pain (for breakthrough pain). Patient not taking: Reported on 04/28/2020 07/20/19   Jacinto Halim, PA-C     Allergies:   No Known Allergies  Social History:   Social History   Socioeconomic History  . Marital status: Single    Spouse name: Not  on file  . Number of children: Not on file  . Years of education: Not on file  . Highest education level: Not on file  Occupational History  . Not on file  Tobacco Use  . Smoking status: Current Every Day Smoker    Packs/day: 1.00    Years: 40.00    Pack years: 40.00  . Smokeless tobacco: Never Used  Vaping Use  . Vaping Use: Never used  Substance and Sexual Activity  . Alcohol use: Yes    Alcohol/week: 11.0 standard drinks    Types: 6 Cans of beer, 5 Shots of liquor per week  . Drug use: Not on file    Comment: infrequently  . Sexual activity: Yes    Partners: Female  Other Topics Concern  . Not on file  Social History Narrative  . Not on file   Social Determinants of Health   Financial Resource Strain:   . Difficulty of Paying Living Expenses:   Food Insecurity:   . Worried About Programme researcher, broadcasting/film/video in the Last Year:   . Barista in the Last Year:   Transportation Needs:   . Freight forwarder (Medical):   [name] Lack of Transportation (Non-Medical):   Physical Activity:   . Days of Exercise per Week:   . Minutes of Exercise per Session:   Stress:   . Feeling of Stress :   Social Connections:   .  Frequency of Communication with Friends and Family:   . Frequency of Social Gatherings with Friends and Family:   . Attends Religious Services:   . Active Member of Clubs or Organizations:   . Attends Banker Meetings:   [name] Marital Status:   Intimate Partner Violence:   . Fear of Current or Ex-Partner:   . Emotionally Abused:   [name] Physically Abused:   . Sexually Abused:     Family History:   The patient's family history includes Heart failure in his father and mother.    ROS:  Please see the history of present illness.  All other ROS reviewed and negative.     Physical Exam/Data:   Vitals:   04/28/20 1251 04/28/20 1253 04/28/20 1258  BP:  (!) 136/103   Pulse:  (!) 28   Resp:  20   Temp:  98.2 F (36.8 C)   TempSrc:  Oral   SpO2: 100%  97%   Weight:   83.9 kg  Height:   5\' 10"  (1.778 m)   No intake or output data in the 24 hours ending 04/28/20 1409 Last 3 Weights 04/28/2020 07/18/2019 07/17/2019  Weight (lbs) 185 lb 188 lb 11.4 oz 185 lb  Weight (kg) 83.915 kg 85.6 kg 83.915 kg     Body mass index is 26.54 kg/m.  General:  Well nourished, well developed, in no acute distress HEENT: normal Lymph: no adenopathy Neck: no JVD Endocrine:  No thryomegaly Vascular: No carotid bruits; FA pulses 2+ bilaterally without bruits  Cardiac:  brady no murmur  Lungs:  clear to auscultation bilaterally, no wheezing, rhonchi or rales  Abd: soft, nontender, no hepatomegaly  Ext: no edema Musculoskeletal:  No deformities, BUE and BLE strength normal and equal Skin: warm and dry  Neuro:  CNs 2-12 intact, no focal abnormalities noted Psych:  Normal affect   ant CV Studies:   Laboratory Data:  High Sensitivity Troponin:  No results for input(s): TROPONINIHS in the last 720 hours.    ChemistryNo results for input(s): NA, K, CL, CO2, GLUCOSE, BUN, CREATININE, CALCIUM, GFRNONAA, GFRAA, ANIONGAP in the last 168 hours.  No results for input(s): PROT, ALBUMIN, AST, ALT, ALKPHOS, BILITOT in the last 168 hours. Hematology Recent Labs  Lab 04/28/20 1310  WBC 11.2*  RBC 4.62  HGB 14.8  HCT 45.2  MCV 97.8  MCH 32.0  MCHC 32.7  RDW 14.5  PLT 241   BNPNo results for input(s): BNP, PROBNP in the last 168 hours.  DDimer No results for input(s): DDIMER in the last 168 hours.   Radiology/Studies:  DG Chest Port 1 View  Result Date: 04/28/2020 CLINICAL DATA:  Shortness of breath and chest pain EXAM: PORTABLE CHEST 1 VIEW COMPARISON:  October 03, 2019 FINDINGS: Mediastinal contour is normal. The heart size is upper limits are normal. There is no focal infiltrate, pulmonary edema, or pleural effusion. No acute abnormality is visualized in bony structures. IMPRESSION: No acute cardiopulmonary disease identified. Electronically Signed   By:  October 05, 2019 M.D.   On: 04/28/2020 13:26   {   Assessment and Plan:   1. Bradycardia - basline trifascicular block with intermittent 2:1 and 3:1 AV block - 1 week of dizziness/lightheadedness, fatigue, DOE - he is not on any av nodal agents at home.  - bp's are stable. Will start dobutamine to see if increases rates. External pads are in place. With stable bp's no indication for temporary pacemaker. Monitor in ICU overnight, EP will need  to see in AM for likely pacemaker placement - f/u TSH, CMET also pending at this time.  - check echo     Severity of Illness  For questions or updates, please contact [facility] Please consult [email or web address] for contact info under     Signed, Dina Rich, MD  04/28/2020 2:09 PM

## 2020-04-28 NOTE — ED Notes (Signed)
Patient is being discharged from the Urgent Care Center and sent to the Emergency Department via ambulance. Per SM, patient is stable but in need of higher level of care due to bradycardia. Patient is aware and verbalizes understanding of plan of care.  Vitals:   04/28/20 1203  BP: 136/81  Pulse: (!) 36  Resp: 20  Temp: 97.8 F (36.6 C)  SpO2: 98%

## 2020-04-28 NOTE — ED Provider Notes (Signed)
[facility] EMERGENCY DEPARTMENT Provider Note   CSN: [id number] Arrival date & time: 04/28/20  1248     History Chief Complaint  Patient presents with  . Bradycardia    Luis Flowers. is a 64 y.o. male.  64 year old male who presents for urgent care due to dizziness and weakness.  When he was there he was found to have a heart rate and 20s.  Patient denies any chest pain or shortness of breath.  Denies any syncope but has noted some near syncope.  Symptoms have been for several weeks.  No treatment use prior to arrival.  Denies any new medications.        Past Medical History:  Diagnosis Date  . Smoker 07/18/2019   pt current smoker 1ppd    Patient Active Problem List   Diagnosis Date Noted  . Hemothorax on left 07/17/2019    Past Surgical History:  Procedure Laterality Date  . IR THORACENTESIS ASP PLEURAL SPACE W/IMG GUIDE  08/29/2019       Family History  Problem Relation Age of Onset  . Heart failure Mother   . Heart failure Father     Social History   Tobacco Use  . Smoking status: Current Every Day Smoker    Packs/day: 1.00    Years: 40.00    Pack years: 40.00  . Smokeless tobacco: Never Used  Vaping Use  . Vaping Use: Never used  Substance Use Topics  . Alcohol use: Yes    Alcohol/week: 11.0 standard drinks    Types: 6 Cans of beer, 5 Shots of liquor per week  . Drug use: Not on file    Comment: infrequently    Home Medications Prior to Admission medications   Medication Sig Start Date End Date Taking? Authorizing Provider  aspirin 325 MG EC tablet Take 325 mg by mouth daily.    [provider]  methocarbamol (ROBAXIN) 500 MG tablet Take 1 tablet (500 mg total) by mouth every 6 (six) hours as needed for muscle spasms. 07/20/19   Maczis, Elmer Sow, PA-C  oxyCODONE (OXY IR/ROXICODONE) 5 MG immediate release tablet Take 1 tablet (5 mg total) by mouth every 6 (six) hours as needed for breakthrough pain (for breakthrough  pain). 07/20/19   Maczis, Elmer Sow, PA-C  phenylephrine-shark liver oil-mineral oil-petrolatum (PREPARATION H) 0.25-14-74.9 % rectal ointment Place 1 application rectally 2 (two) times daily as needed for hemorrhoids.    [provider]    Allergies    Patient has no known allergies.  Review of Systems   Review of Systems  All other systems reviewed and are negative.   Physical Exam Updated Vital Signs BP (!) 136/103 (BP Location: Left Arm)   Pulse (!) 28   Temp 98.2 F (36.8 C) (Oral)   Resp 20   Ht 1.778 m (5\' 10" )   Wt 83.9 kg   SpO2 97%   BMI 26.54 kg/m   Physical Exam Vitals and nursing note reviewed.  Constitutional:      General: He is not in acute distress.    Appearance: Normal appearance. He is well-developed. He is not toxic-appearing.  HENT:     Head: Normocephalic and atraumatic.  Eyes:     General: Lids are normal.     Conjunctiva/sclera: Conjunctivae normal.     Pupils: Pupils are equal, round, and reactive to light.  Neck:     Thyroid: No thyroid mass.     Trachea: No tracheal deviation.  Cardiovascular:  Rate and Rhythm: Bradycardia present. Rhythm irregular.     Heart sounds: Normal heart sounds. No murmur heard.  No gallop.   Pulmonary:     Effort: Pulmonary effort is normal. No respiratory distress.     Breath sounds: Normal breath sounds. No stridor. No decreased breath sounds, wheezing, rhonchi or rales.  Abdominal:     General: Bowel sounds are normal. There is no distension.     Palpations: Abdomen is soft.     Tenderness: There is no abdominal tenderness. There is no rebound.  Musculoskeletal:        General: No tenderness. Normal range of motion.     Cervical back: Normal range of motion and neck supple.  Skin:    General: Skin is warm and dry.     Findings: No abrasion or rash.  Neurological:     Mental Status: He is alert and oriented to person, place, and time.     GCS: GCS eye subscore is 4. GCS verbal subscore is 5.  GCS motor subscore is 6.     Cranial Nerves: No cranial nerve deficit.     Sensory: No sensory deficit.  Psychiatric:        Speech: Speech normal.        Behavior: Behavior normal.     ED Results / Procedures / Treatments   Labs (all labs ordered are listed, but only abnormal results are displayed) Labs Reviewed  SARS CORONAVIRUS 2 BY RT PCR (HOSPITAL ORDER, PERFORMED IN [facility])  CBC WITH DIFFERENTIAL/PLATELET  COMPREHENSIVE METABOLIC PANEL  TSH  MAGNESIUM  TROPONIN I (HIGH SENSITIVITY)    EKG EKG Interpretation  Date/Time:  Monday April 28 2020 12:52:30 EDT Ventricular Rate:  27 PR Interval:    QRS Duration: 155 QT Interval:  500 QTC Calculation: 335 R Axis:   -73 Text Interpretation: Predominant 3:1 AV block Biatrial enlargement RBBB and LAFB Baseline wander in lead(s) V1 Confirmed by Lorre Nick ([id number]) on 04/28/2020 1:11:27 PM   Radiology No results found.  Procedures Procedures (including critical care time)  Medications Ordered in ED Medications  0.9 %  sodium chloride infusion (has no administration in time range)    ED Course  I have reviewed the triage vital signs and the nursing notes.  Pertinent labs & imaging results that were available during my care of the patient were reviewed by me and considered in my medical decision making (see chart for details).    MDM Rules/Calculators/A&P                          Patient evidence of third-degree heart block here.  Patient has been placed on Zoll pads.  Discussed with Dr. Danielle Dess from cardiology who will come and see patient.  Patient blood pressure has been stable.  CRITICAL CARE Performed by: Toy Baker Total critical care time: 45 minutes Critical care time was exclusive of separately billable procedures and treating other patients. Critical care was necessary to treat or prevent imminent or life-threatening deterioration. Critical care was time spent personally by me on the  following activities: development of treatment plan with patient and/or surrogate as well as nursing, discussions with consultants, evaluation of patient's response to treatment, examination of patient, obtaining history from patient or surrogate, ordering and performing treatments and interventions, ordering and review of laboratory studies, ordering and review of radiographic studies, pulse oximetry and re-evaluation of patient's condition.  Final Clinical Impression(s) / ED  Diagnoses Final diagnoses:  None    Rx / DC Orders ED Discharge Orders    None       Lorre Nick, MD 04/28/20 1312

## 2020-04-28 NOTE — ED Notes (Signed)
O2 2L [place] placed and pt placed on cardiac monitoring while awaiting EMS. Report called to [facility] ED charge RN PIV placed, pt put into gown.  Patient is being discharged from the Urgent Care and sent to the Emergency Department via EMS. Per Nile Riggs patient is in need of higher level of care due to bradycadia. Patient is aware and verbalizes understanding of plan of care.  Vitals:   04/28/20 1203  BP: 136/81  Pulse: (!) 36  Resp: 20  Temp: 97.8 F (36.6 C)  SpO2: 98%

## 2020-04-28 NOTE — ED Notes (Signed)
Report called to [facility] ER charge RN

## 2020-04-29 ENCOUNTER — Encounter (HOSPITAL_COMMUNITY): Payer: Self-pay | Admitting: Cardiology

## 2020-04-29 ENCOUNTER — Inpatient Hospital Stay (HOSPITAL_COMMUNITY)
Admission: EM | Disposition: A | Discharge status: Home / Self Care | Payer: Self-pay | Source: Home / Self Care | Attending: Cardiology

## 2020-04-29 DIAGNOSIS — I441 Atrioventricular block, second degree: Principal | ICD-10-CM

## 2020-04-29 HISTORY — PX: PACEMAKER IMPLANT: EP1218

## 2020-04-29 LAB — BASIC METABOLIC PANEL
Anion gap: 10 (ref 5–15)
BUN: 27 mg/dL — ABNORMAL HIGH (ref 8–23)
CO2: 20 mmol/L — ABNORMAL LOW (ref 22–32)
Calcium: 9.2 mg/dL (ref 8.9–10.3)
Chloride: 109 mmol/L (ref 98–111)
Creatinine, Ser: 1.42 mg/dL — ABNORMAL HIGH (ref 0.61–1.24)
GFR calc Af Amer: >60 mL/min (ref >60)
GFR calc non Af Amer: 52 mL/min — ABNORMAL LOW (ref >60)
Glucose, Bld: 100 mg/dL — ABNORMAL HIGH (ref 70–99)
Potassium: 4.1 mmol/L (ref 3.5–5.1)
Sodium: 139 mmol/L (ref 135–145)

## 2020-04-29 LAB — CBC
HCT: 41.7 % (ref 39.0–52.0)
Hemoglobin: 13.7 g/dL (ref 13.0–17.0)
MCH: 31.6 pg (ref 26.0–34.0)
MCHC: 32.9 g/dL (ref 30.0–36.0)
MCV: 96.3 fL (ref 80.0–100.0)
Platelets: 212 10*3/uL (ref 150–400)
RBC: 4.33 MIL/uL (ref 4.22–5.81)
RDW: 14.6 % (ref 11.5–15.5)
WBC: 12.8 10*3/uL — ABNORMAL HIGH (ref 4.0–10.5)
nRBC: 0 % (ref 0.0–0.2)

## 2020-04-29 LAB — SURGICAL PCR SCREEN
MRSA, PCR: NEGATIVE
Staphylococcus aureus: NEGATIVE

## 2020-04-29 SURGERY — PACEMAKER IMPLANT

## 2020-04-29 MED ORDER — LIDOCAINE HCL 1 % IJ SOLN
INTRAMUSCULAR | Status: AC
  Filled 2020-04-29: qty 20

## 2020-04-29 MED ORDER — SODIUM CHLORIDE 0.9 % IV SOLN
INTRAVENOUS | Status: AC
  Filled 2020-04-29: qty 2

## 2020-04-29 MED ORDER — ONDANSETRON HCL 4 MG/2ML IJ SOLN: 4.0000 mg | Freq: Four times a day (QID) | INTRAMUSCULAR | Status: DC | PRN

## 2020-04-29 MED ORDER — HEPARIN (PORCINE) IN NACL 1000-0.9 UT/500ML-% IV SOLN
INTRAVENOUS | Status: AC
  Filled 2020-04-29: qty 500

## 2020-04-29 MED ORDER — CEFAZOLIN SODIUM-DEXTROSE 1-4 GM/50ML-% IV SOLN
1.0000 g | Freq: Four times a day (QID) | INTRAVENOUS | Status: AC
  Administered 2020-04-29 – 2020-04-30 (×3): 1 g via INTRAVENOUS
  Filled 2020-04-29 (×3): qty 50

## 2020-04-29 MED ORDER — ACETAMINOPHEN 325 MG PO TABS: 325.0000 mg | ORAL_TABLET | ORAL | Status: DC | PRN

## 2020-04-29 MED ORDER — LIDOCAINE HCL 1 % IJ SOLN
INTRAMUSCULAR | Status: AC
  Filled 2020-04-29: qty 60

## 2020-04-29 MED ORDER — SODIUM CHLORIDE 0.9% FLUSH: 3.0000 mL | INTRAVENOUS | Status: DC | PRN

## 2020-04-29 MED ORDER — SODIUM CHLORIDE 0.9 % IV SOLN: 250.0000 mL | INTRAVENOUS | Status: DC | PRN

## 2020-04-29 MED ORDER — CHLORHEXIDINE GLUCONATE CLOTH 2 % EX PADS
6.0000 | MEDICATED_PAD | Freq: Every day | CUTANEOUS | Status: DC
  Administered 2020-04-29: 6 via TOPICAL

## 2020-04-29 MED ORDER — FENTANYL CITRATE (PF) 100 MCG/2ML IJ SOLN
INTRAMUSCULAR | Status: AC
  Filled 2020-04-29: qty 2

## 2020-04-29 MED ORDER — IOHEXOL 350 MG/ML SOLN
INTRAVENOUS | Status: DC | PRN
  Administered 2020-04-29: 15 mL

## 2020-04-29 MED ORDER — SODIUM CHLORIDE 0.9 % IV SOLN: INTRAVENOUS | Status: DC

## 2020-04-29 MED ORDER — LIDOCAINE HCL (PF) 1 % IJ SOLN
INTRAMUSCULAR | Status: DC | PRN
  Administered 2020-04-29: 60 mL

## 2020-04-29 MED ORDER — FENTANYL CITRATE (PF) 100 MCG/2ML IJ SOLN
INTRAMUSCULAR | Status: DC | PRN
  Administered 2020-04-29: 25 ug via INTRAVENOUS

## 2020-04-29 MED ORDER — SODIUM CHLORIDE 0.9% FLUSH: 3.0000 mL | Freq: Two times a day (BID) | INTRAVENOUS | Status: DC

## 2020-04-29 MED ORDER — MIDAZOLAM HCL 5 MG/5ML IJ SOLN
INTRAMUSCULAR | Status: AC
  Filled 2020-04-29: qty 5

## 2020-04-29 MED ORDER — HYDROCODONE-ACETAMINOPHEN 5-325 MG PO TABS
1.0000 | ORAL_TABLET | ORAL | Status: DC | PRN
  Administered 2020-04-29: 1 via ORAL
  Administered 2020-04-30 (×2): 2 via ORAL
  Filled 2020-04-29: qty 1
  Filled 2020-04-29 (×2): qty 2

## 2020-04-29 MED ORDER — CEFAZOLIN SODIUM-DEXTROSE 2-4 GM/100ML-% IV SOLN
INTRAVENOUS | Status: AC
  Filled 2020-04-29: qty 100

## 2020-04-29 MED ORDER — CEFAZOLIN SODIUM-DEXTROSE 2-4 GM/100ML-% IV SOLN
2.0000 g | INTRAVENOUS | Status: AC
  Administered 2020-04-29: 2 g via INTRAVENOUS
  Filled 2020-04-29 (×3): qty 100

## 2020-04-29 MED ORDER — SODIUM CHLORIDE 0.9 % IV SOLN
80.0000 mg | INTRAVENOUS | Status: AC
  Administered 2020-04-29: 80 mg
  Filled 2020-04-29 (×2): qty 2

## 2020-04-29 MED ORDER — CHLORHEXIDINE GLUCONATE 4 % EX LIQD
60.0000 mL | Freq: Once | CUTANEOUS | Status: AC
  Administered 2020-04-29: 4 via TOPICAL
  Filled 2020-04-29: qty 15

## 2020-04-29 MED ORDER — HEPARIN (PORCINE) IN NACL 2-0.9 UNITS/ML
INTRAMUSCULAR | Status: AC | PRN
  Administered 2020-04-29: 500 mL

## 2020-04-29 MED ORDER — MIDAZOLAM HCL 5 MG/5ML IJ SOLN
INTRAMUSCULAR | Status: DC | PRN
  Administered 2020-04-29: 1 mg via INTRAVENOUS

## 2020-04-29 SURGICAL SUPPLY — 7 items
CABLE SURGICAL S-101-97-12 (CABLE) ×3 IMPLANT
LEAD TENDRIL MRI 52CM LPA1200M (Lead) ×2 IMPLANT
LEAD TENDRIL MRI 58CM LPA1200M (Lead) ×2 IMPLANT
PACEMAKER ASSURITY DR-RF (Pacemaker) ×2 IMPLANT
PAD PRO RADIOLUCENT 2001M-C (PAD) ×3 IMPLANT
SHEATH 8FR PRELUDE SNAP 13 (SHEATH) ×4 IMPLANT
TRAY PACEMAKER INSERTION (PACKS) ×3 IMPLANT
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
## 2020-04-29 NOTE — Interval H&P Note (Signed)
History and Physical Interval Note:  04/29/2020 2:19 PM  Luis Flowers.  has presented today for surgery, with the diagnosis of heart block.  The various methods of treatment have been discussed with the patient and family. After consideration of risks, benefits and other options for treatment, the patient has consented to  Procedure(s): PACEMAKER IMPLANT (N/A) as a surgical intervention.  The patient's history has been reviewed, patient examined, no change in status, stable for surgery.  I have reviewed the patient's chart and labs.  Questions were answered to the patient's satisfaction.     Luis Flowers
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
## 2020-04-29 NOTE — H&P (Addendum)
Original note reformatted as an H&P   Cardiology Admission History and Physical:   Patient ID: Luis Flowers. MRN: [id number]; DOB: 23-Jul-1956   Admission date: 04/28/2020  Primary Care Provider: Patient, No Pcp Per [facility] Cardiologist: [facility] Electrophysiologist:  None   Chief Complaint:  Fatigue, dizziness  Patient Profile:   Luis Nephew. is a 64 y.o. male with history of tobacco abuse presents with 1 week of dizziness, fatigue, and DOE  History of Present Illness:   Luis Flowers 64 yo male no prior cardiac history presents with 1 week of significant dizziness/lightheadeness, fatigue, and DOE. No chest pains. No LE edema. In ER found to be severely bradycardic to the 20s, EKG showed 2:1 and 3:1 AV block. Cardiology consulted. He is not on any av nodal agents at home. BP in ER is stable.    WBC 11.2 Hgb 14.8 Plt 241 CMET pending TSH pending EKG 2:1 and 2:1 AV block, LAFB, RBBB  Past Medical History:  Diagnosis Date  . Smoker 07/18/2019   pt current smoker 1ppd    Past Surgical History:  Procedure Laterality Date  . IR THORACENTESIS ASP PLEURAL SPACE W/IMG GUIDE  08/29/2019     Medications Prior to Admission: Prior to Admission medications   Medication Sig Start Date End Date Taking? Authorizing Provider  aspirin 325 MG EC tablet Take 650 mg by mouth 3 (three) times daily.    Yes [provider]  methocarbamol (ROBAXIN) 500 MG tablet Take 1 tablet (500 mg total) by mouth every 6 (six) hours as needed for muscle spasms. Patient not taking: Reported on 04/28/2020 07/20/19   Maczis, Elmer Sow, PA-C  oxyCODONE (OXY IR/ROXICODONE) 5 MG immediate release tablet Take 1 tablet (5 mg total) by mouth every 6 (six) hours as needed for breakthrough pain (for breakthrough pain). Patient not taking: Reported on 04/28/2020 07/20/19   Jacinto Halim, PA-C     Allergies:   No Known Allergies  Social History:   Social History   Socioeconomic History  .  Marital status: Single    Spouse name: Not on file  . Number of children: Not on file  . Years of education: Not on file  . Highest education level: Not on file  Occupational History  . Not on file  Tobacco Use  . Smoking status: Current Every Day Smoker    Packs/day: 1.00    Years: 40.00    Pack years: 40.00  . Smokeless tobacco: Never Used  Vaping Use  . Vaping Use: Never used  Substance and Sexual Activity  . Alcohol use: Yes    Alcohol/week: 11.0 standard drinks    Types: 6 Cans of beer, 5 Shots of liquor per week  . Drug use: Not on file    Comment: infrequently  . Sexual activity: Yes    Partners: Female  Other Topics Concern  . Not on file  Social History Narrative  . Not on file   Social Determinants of Health   Financial Resource Strain:   . Difficulty of Paying Living Expenses:   Food Insecurity:   . Worried About Programme researcher, broadcasting/film/video in the Last Year:   . Barista in the Last Year:   Transportation Needs:   . Freight forwarder (Medical):   [name] Lack of Transportation (Non-Medical):   Physical Activity:   . Days of Exercise per Week:   . Minutes of Exercise per Session:   Stress:   .  Feeling of Stress :   Social Connections:   . Frequency of Communication with Friends and Family:   . Frequency of Social Gatherings with Friends and Family:   . Attends Religious Services:   . Active Member of Clubs or Organizations:   . Attends Banker Meetings:   [name] Marital Status:   Intimate Partner Violence:   . Fear of Current or Ex-Partner:   . Emotionally Abused:   [name] Physically Abused:   . Sexually Abused:     Family History:   The patient's family history includes Heart failure in his father and mother.    ROS:  Please see the history of present illness.  All other ROS reviewed and negative.     Physical Exam/Data:   Vitals:   04/28/20 1251 04/28/20 1253 04/28/20 1258  BP:  (!) 136/103   Pulse:  (!) 28   Resp:  20   Temp:  98.2  F (36.8 C)   TempSrc:  Oral   SpO2: 100% 97%   Weight:   83.9 kg  Height:   5\' 10"  (1.778 m)   No intake or output data in the 24 hours ending 04/28/20 1409 Last 3 Weights 04/28/2020 07/18/2019 07/17/2019  Weight (lbs) 185 lb 188 lb 11.4 oz 185 lb  Weight (kg) 83.915 kg 85.6 kg 83.915 kg     Body mass index is 26.54 kg/m.  General:  Well nourished, well developed, in no acute distress HEENT: normal Lymph: no adenopathy Neck: no JVD Endocrine:  No thryomegaly Vascular: No carotid bruits; FA pulses 2+ bilaterally without bruits  Cardiac:  brady no murmur  Lungs:  clear to auscultation bilaterally, no wheezing, rhonchi or rales  Abd: soft, nontender, no hepatomegaly  Ext: no edema Musculoskeletal:  No deformities, BUE and BLE strength normal and equal Skin: warm and dry  Neuro:  CNs 2-12 intact, no focal abnormalities noted Psych:  Normal affect   ant CV Studies:   Laboratory Data:  High Sensitivity Troponin:  No results for input(s): TROPONINIHS in the last 720 hours.    ChemistryNo results for input(s): NA, K, CL, CO2, GLUCOSE, BUN, CREATININE, CALCIUM, GFRNONAA, GFRAA, ANIONGAP in the last 168 hours.  No results for input(s): PROT, ALBUMIN, AST, ALT, ALKPHOS, BILITOT in the last 168 hours. Hematology Recent Labs  Lab 04/28/20 1310  WBC 11.2*  RBC 4.62  HGB 14.8  HCT 45.2  MCV 97.8  MCH 32.0  MCHC 32.7  RDW 14.5  PLT 241   BNPNo results for input(s): BNP, PROBNP in the last 168 hours.  DDimer No results for input(s): DDIMER in the last 168 hours.   Radiology/Studies:  DG Chest Port 1 View  Result Date: 04/28/2020 CLINICAL DATA:  Shortness of breath and chest pain EXAM: PORTABLE CHEST 1 VIEW COMPARISON:  October 03, 2019 FINDINGS: Mediastinal contour is normal. The heart size is upper limits are normal. There is no focal infiltrate, pulmonary edema, or pleural effusion. No acute abnormality is visualized in bony structures. IMPRESSION: No acute cardiopulmonary  disease identified. Electronically Signed   By: October 05, 2019 M.D.   On: 04/28/2020 13:26   {   Assessment and Plan:   1. Bradycardia - basline trifascicular block with intermittent 2:1 and 3:1 AV block - 1 week of dizziness/lightheadedness, fatigue, DOE - he is not on any av nodal agents at home.  - bp's are stable. Will start dobutamine to see if increases rates. External pads are in place. With stable bp's no  indication for temporary pacemaker. Monitor in ICU overnight, EP will need to see in AM for likely pacemaker placement - f/u TSH, CMET also pending at this time.  - check echo     Severity of Illness  For questions or updates, please contact [facility] Please consult [email or web address] for contact info under     Signed, Dina Rich, MD  04/28/2020 2:09 PM

## 2020-04-30 ENCOUNTER — Encounter (HOSPITAL_COMMUNITY): Payer: Self-pay | Admitting: Internal Medicine

## 2020-04-30 ENCOUNTER — Inpatient Hospital Stay (HOSPITAL_COMMUNITY): Payer: Self-pay

## 2020-04-30 LAB — BASIC METABOLIC PANEL
Anion gap: 8 (ref 5–15)
BUN: 19 mg/dL (ref 8–23)
CO2: 20 mmol/L — ABNORMAL LOW (ref 22–32)
Calcium: 8.9 mg/dL (ref 8.9–10.3)
Chloride: 112 mmol/L — ABNORMAL HIGH (ref 98–111)
Creatinine, Ser: 1.01 mg/dL (ref 0.61–1.24)
GFR calc Af Amer: >60 mL/min (ref >60)
GFR calc non Af Amer: >60 mL/min (ref >60)
Glucose, Bld: 109 mg/dL — ABNORMAL HIGH (ref 70–99)
Potassium: 4.1 mmol/L (ref 3.5–5.1)
Sodium: 140 mmol/L (ref 135–145)

## 2020-04-30 MED ORDER — ACETAMINOPHEN 325 MG PO TABS: 325.0000 mg | ORAL_TABLET | ORAL | Status: AC | PRN

## 2020-04-30 MED FILL — Lidocaine HCl Local Inj 1%: INTRAMUSCULAR | Qty: 20 | Status: AC

## 2020-04-30 MED FILL — Lidocaine HCl Local Inj 1%: INTRAMUSCULAR | Qty: 60 | Status: AC

## 2020-04-30 NOTE — Discharge Summary (Addendum)
ELECTROPHYSIOLOGY PROCEDURE DISCHARGE SUMMARY    Patient ID: Luis Toomey.,  MRN: [id number], DOB/AGE: 64-Aug-1957 64 y.o.  Admit date: 04/28/2020 Discharge date: 04/30/2020  Primary Care Physician: Patient, No Pcp Per  Electrophysiologist: Dr. Johney Frame  Primary Discharge Diagnosis:  Symptomatic bradycardia due to mobitz II second degree AV block status post pacemaker implantation this admission  Secondary Discharge Diagnosis:  Tobacco abuse  No Known Allergies   Procedures This Admission:  1.  Implantation of a St. Jude dual chamber PPM on 04/29/2020 by Dr. Johney Frame.  The patient received a St. Jude model number [id number] PPM with model number [id number] right atrial lead and [id number] right ventricular lead. There were no immediate post procedure complications. 2.  CXR on 04/30/20 demonstrated no pneumothorax status post device implantation.   Brief HPI: Luis Leifheit. is a 64 y.o. male was admitted for lightheadedness and fatigue and found to have advanced heart block with rates in the 20-40s at times. Electrophysiology team asked to see for consideration of PPM implantation.  Past medical history includes above.  The patient has had symptomatic bradycardia without reversible causes identified.  Risks, benefits, and alternatives to PPM implantation were reviewed with the patient who wished to proceed.   Hospital Course:  The patient was admitted and underwent implantation of a St. Jude dual chamber PPM with details as outlined above.  He was monitored on telemetry overnight which demonstrated V pacing in the 60s.  Left chest was without hematoma or ecchymosis.  The device was interrogated and found to be functioning normally.  CXR was obtained and demonstrated no pneumothorax status post device implantation.  Wound care, arm mobility, and restrictions were reviewed with the patient.  The patient was examined and considered stable for discharge to home.    Pt is not on an anticoagulant.    Physical Exam: Vitals:   04/29/20 2357 04/30/20 0500 04/30/20 0647 04/30/20 0816  BP: (!) 150/80 131/76  131/68  Pulse: 69 62 68 69  Resp: 18   16  Temp: 98.4 F (36.9 C)  98.4 F (36.9 C) 98.4 F (36.9 C)  TempSrc: Oral  Oral Oral  SpO2: 91% 93% 93% 93%  Weight:   88 kg   Height:        GEN- The patient is well appearing, alert and oriented x 3 today.   HEENT: normocephalic, atraumatic; sclera clear, conjunctiva pink; hearing intact; oropharynx clear; neck supple, no JVP Lymph- no cervical lymphadenopathy Lungs- Clear to ausculation bilaterally, normal work of breathing.  No wheezes, rales, rhonchi Heart- Regular rate and rhythm, no murmurs, rubs or gallops, PMI not laterally displaced GI- soft, non-tender, non-distended, bowel sounds present, no hepatosplenomegaly Extremities- no clubbing, cyanosis, or edema; DP/PT/radial pulses 2+ bilaterally MS- no significant deformity or atrophy Skin- warm and dry, no rash or lesion, left chest without hematoma/ecchymosis Psych- euthymic mood, full affect Neuro- strength and sensation are intact   Labs:   Lab Results  Component Value Date   WBC 12.8 (H) 04/29/2020   HGB 13.7 04/29/2020   HCT 41.7 04/29/2020   MCV 96.3 04/29/2020   PLT 212 04/29/2020    Recent Labs  Lab 04/28/20 1310 04/28/20 1704 04/30/20 0745  NA 139   < > 140  K 4.9   < > 4.1  CL 108   < > 112*  CO2 21*   < > 20*  BUN 19   < > 19  CREATININE 1.07   < > 1.01  CALCIUM 9.4   < > 8.9  PROT 6.0*  --   --   BILITOT 1.6*  --   --   ALKPHOS 76  --   --   ALT 60*  --   --   AST 29  --   --   GLUCOSE 87   < > 109*   < > = values in this interval not displayed.    Discharge Medications:  Allergies as of 04/30/2020   No Known Allergies     Medication List    STOP taking these medications   aspirin 325 MG EC tablet     TAKE these medications   acetaminophen 325 MG tablet Commonly known as: TYLENOL Take 1-2 tablets (325-650 mg total) by mouth  every 4 (four) hours as needed for mild pain.   methocarbamol 500 MG tablet Commonly known as: ROBAXIN Take 1 tablet (500 mg total) by mouth every 6 (six) hours as needed for muscle spasms.   oxyCODONE 5 MG immediate release tablet Commonly known as: Oxy IR/ROXICODONE Take 1 tablet (5 mg total) by mouth every 6 (six) hours as needed for breakthrough pain (for breakthrough pain).       Disposition:    Follow-up Information    [name], MD Follow up on 08/04/2020.   Specialty: Cardiology Why: at 3 bpm for 3 month pacemaker check  Contact information: [address] [place] [address] [phone]        [facility] Follow up on 05/13/2020.   Why: at 0930 for post pacemaker wound check Contact information: [address] [place] [address] [phone]              Duration of Discharge Encounter: Greater than 30 minutes including physician time.  Signed, Graciella Freer, PA-C  04/30/2020 9:22 AM   Device interrogation is personally reviewed and normal CXR reveals stable leads and no ptx  DC to home with routine wound care and follow-up.  [name] MD, [facility] 04/30/2020 5:18 PM

## 2020-04-30 NOTE — Discharge Instructions (Signed)
After Your Pacemaker   . You have a St. Jude Pacemaker  ACTIVITY . Do not lift your arm above shoulder height for 1 week after your procedure. After 7 days, you may progress as below.     Wednesday May 07, 2020  Thursday May 08, 2020 Friday May 09, 2020 Saturday May 10, 2020   . Do not lift, push, pull, or carry anything over 10 pounds with the affected arm until 6 weeks (Wednesday June 11, 2020 ) after your procedure.   . Do NOT DRIVE until you have been seen for your wound check, or as long as instructed by your healthcare provider.   . Ask your healthcare provider when you can go back to work   INCISION/Dressing . If you are on a blood thinner such as Coumadin, Xarelto, Eliquis, Plavix, or Pradaxa please confirm with your provider when this should be resumed.   . Monitor your Pacemaker site for redness, swelling, and drainage. Call the device clinic at [phone] if you experience these symptoms or fever/chills.  . If your incision is sealed with Steri-strips or staples, you may shower 10 days after your procedure or when told by your provider. Do not remove the steri-strips or let the shower hit directly on your site. You may wash around your site with soap and water.    [name] Avoid lotions, ointments, or perfumes over your incision until it is well-healed.  . You may use a hot tub or a pool AFTER your wound check appointment if the incision is completely closed.  [name] PAcemaker Alerts:  Some alerts are vibratory and others beep. These are NOT emergencies. Please call our office to let us know. If this occurs at night or on weekends, it can wait until the next business day. Send a remote transmission.  . If your device is capable of reading fluid status (for heart failure), you will be offered monthly monitoring to review this with you.   DEVICE MANAGEMENT . Remote monitoring is used to monitor your pacemaker from home. This monitoring is scheduled every 91 days by our office.  It allows [place] to keep an eye on the functioning of your device to ensure it is working properly. You will routinely see your Electrophysiologist annually (more often if necessary).   . You should receive your ID card for your new device in 4-8 weeks. Keep this card with you at all times once received. Consider wearing a medical alert bracelet or necklace.  . Your Pacemaker may be MRI compatible. This will be discussed at your next office visit/wound check.  You should avoid contact with strong electric or magnetic fields.    Do not use amateur (ham) radio equipment or electric (arc) welding torches. MP3 player headphones with magnets should not be used. Some devices are safe to use if held at least 12 inches (30 cm) from your Pacemaker. These include power tools, lawn mowers, and speakers. If you are unsure if something is safe to use, ask your health care provider.   When using your cell phone, hold it to the ear that is on the opposite side from the Pacemaker. Do not leave your cell phone in a pocket over the Pacemaker.   You may safely use electric blankets, heating pads, computers, and microwave ovens.  Call the office right away if:  You have chest pain.  You feel more short of breath than you have felt before.  You feel more light-headed than you have felt before.  Your incision starts to open up.  This information is not intended to replace advice given to you by your health care provider. Make sure you discuss any questions you have with your health care provider.

## 2020-04-30 NOTE — Plan of Care (Signed)
  Problem: Education: Goal: Ability to safely manage health related needs after discharge will improve Outcome: Completed/Met   Problem: Cardiac: Goal: Ability to achieve and maintain adequate cardiopulmonary perfusion will improve Outcome: Completed/Met   

## 2020-04-30 NOTE — Care Management (Signed)
1021 04-30-20 Hospital follow up appointment scheduled at the [facility]. Pharmacy is onsite if patient needs additional assistance with medications. No further needs from Case Manager at this time. Graves-Bigelow, Lamar Laundry, RN, BSN Case Manager

## 2020-05-12 ENCOUNTER — Telehealth: Payer: Self-pay

## 2020-05-12 NOTE — Telephone Encounter (Signed)
Received Merlin alert for new onset of AF/AFL, No OAC's noted. Spoke to Dr. Johney Frame, states we can continue to monitor at this time and if it progresses he may discuss OAS. Patient has wound/device check apt 05/13/20. Arline Asp, RN updated on this information.

## 2020-05-13 ENCOUNTER — Encounter (HOSPITAL_COMMUNITY): Payer: Self-pay | Admitting: Physician Assistant

## 2020-05-13 ENCOUNTER — Telehealth: Payer: Self-pay | Admitting: Emergency Medicine

## 2020-05-13 ENCOUNTER — Other Ambulatory Visit: Payer: Self-pay

## 2020-05-13 ENCOUNTER — Ambulatory Visit (INDEPENDENT_AMBULATORY_CARE_PROVIDER_SITE_OTHER): Payer: Self-pay | Admitting: Emergency Medicine

## 2020-05-13 ENCOUNTER — Ambulatory Visit (HOSPITAL_COMMUNITY)
Admission: RE | Admit: 2020-05-13 | Discharge: 2020-05-13 | Disposition: A | Discharge status: Home / Self Care | Payer: Self-pay | Source: Ambulatory Visit | Attending: Physician Assistant | Admitting: Physician Assistant

## 2020-05-13 VITALS — BP 140/96 | HR 80 | Ht 70.0 in | Wt 187.6 lb

## 2020-05-13 DIAGNOSIS — R5383 Other fatigue: Secondary | ICD-10-CM | POA: Insufficient documentation

## 2020-05-13 DIAGNOSIS — I442 Atrioventricular block, complete: Secondary | ICD-10-CM

## 2020-05-13 DIAGNOSIS — F17210 Nicotine dependence, cigarettes, uncomplicated: Secondary | ICD-10-CM | POA: Insufficient documentation

## 2020-05-13 DIAGNOSIS — Z7901 Long term (current) use of anticoagulants: Secondary | ICD-10-CM | POA: Insufficient documentation

## 2020-05-13 DIAGNOSIS — R400 Somnolence: Secondary | ICD-10-CM | POA: Insufficient documentation

## 2020-05-13 DIAGNOSIS — R0683 Snoring: Secondary | ICD-10-CM | POA: Insufficient documentation

## 2020-05-13 DIAGNOSIS — R0681 Apnea, not elsewhere classified: Secondary | ICD-10-CM

## 2020-05-13 DIAGNOSIS — Z950 Presence of cardiac pacemaker: Secondary | ICD-10-CM | POA: Insufficient documentation

## 2020-05-13 DIAGNOSIS — I483 Typical atrial flutter: Secondary | ICD-10-CM

## 2020-05-13 DIAGNOSIS — Z8249 Family history of ischemic heart disease and other diseases of the circulatory system: Secondary | ICD-10-CM | POA: Insufficient documentation

## 2020-05-13 DIAGNOSIS — G4730 Sleep apnea, unspecified: Secondary | ICD-10-CM | POA: Insufficient documentation

## 2020-05-13 DIAGNOSIS — I480 Paroxysmal atrial fibrillation: Secondary | ICD-10-CM | POA: Insufficient documentation

## 2020-05-13 MED ORDER — RIVAROXABAN 20 MG PO TABS
ORAL_TABLET | ORAL | 0 refills | Status: DC
Start: 2020-05-13 — End: 2020-05-21

## 2020-05-13 NOTE — Telephone Encounter (Signed)
Patient had wound check today.  Having episodes OF AF/AFl  no OAC at this time . New onset with intermittent episodes starting 05/07/20 . AF Clinic recommended by Dr Ladona Ridgel who is covering Dr Jenel Lucks patients. Patient is symptomatic with decreased energy.

## 2020-05-13 NOTE — Progress Notes (Signed)
Primary Care Physician: Patient, No Pcp Per Primary Cardiologist: Dr Wyline Mood Primary Electrophysiologist: Dr Johney Frame Referring Physician: Dr Bartolo Darter clinic   Luis Flowers Harsh Luis Flowers. is a 64 y.o. male with a history of tobacco abuse and CHB s/p PPM, and paroxysmal atrial fibrillation who presents for follow up in the [facility]. Patient presented to the ED with 1-2 weeks of lightheadedness, dizziness, fatigue, and SOB. EKG on arrival showed HRs in the 20s with 3:1 AV block vs CHB. Started on dobutamine with no real improvement in rate. He was admitted and underwent PPM implantation on 04/29/20. The patient was initially diagnosed with atrial fibrillation 05/12/20 on his device. He has a CHADS2VASC score of 0. Patient states he has symptoms of fatigue for the last several days. He appears to be in typical atrial flutter today with V pacing. He does admit to alcohol use, snoring, witnessed apnea, and daytime somnolence.   Today, he denies symptoms of palpitations, chest pain, shortness of breath, orthopnea, PND, lower extremity edema, dizziness, presyncope, syncope, bleeding, or neurologic sequela. The patient is tolerating medications without difficulties and is otherwise without complaint today.    Atrial Fibrillation Risk Factors:  he does have symptoms or diagnosis of sleep apnea. he does not have a history of rheumatic fever. he does have a history of alcohol use. The patient does not have a history of early familial atrial fibrillation or other arrhythmias.  he has a BMI of Body mass index is 26.92 kg/m.[name] Filed Weights   05/13/20 1358  Weight: 85.1 kg    Family History  Problem Relation Age of Onset   Heart failure Mother    Heart failure Father      Atrial Fibrillation Management history:  Previous antiarrhythmic drugs: none Previous cardioversions: none Previous ablations: none CHADS2VASC score: 0 Anticoagulation history: none   Past Medical  History:  Diagnosis Date   Smoker 07/18/2019   pt current smoker 1ppd   Past Surgical History:  Procedure Laterality Date   IR THORACENTESIS ASP PLEURAL SPACE W/IMG GUIDE  08/29/2019   PACEMAKER IMPLANT N/A 04/29/2020   Procedure: PACEMAKER IMPLANT;  Surgeon: [name], MD;  Location: [facility]VAS[facility] CV [facility];  Service: Cardiovascular;  Laterality: N/A;    Current Outpatient Medications  Medication Sig Dispense Refill   acetaminophen (TYLENOL) 325 MG tablet Take 1-2 tablets (325-650 mg total) by mouth every 4 (four) hours as needed for mild pain.     rivaroxaban (XARELTO) 20 MG TABS tablet Take one tablet by mouth daily with meal 30 tablet 0   No current facility-administered medications for this encounter.    No Known Allergies  Social History   Socioeconomic History   Marital status: Single    Spouse name: Not on file   Number of children: Not on file   Years of education: Not on file   Highest education level: Not on file  Occupational History   Not on file  Tobacco Use   Smoking status: Former Smoker    Packs/day: 1.00    Years: 40.00    Pack years: 40.00    Types: Cigarettes   Smokeless tobacco: Never Used  Vaping Use   Vaping Use: Never used  Substance and Sexual Activity   Alcohol use: Yes    Alcohol/week: 11.0 standard drinks    Types: 6 Cans of beer, 5 Shots of liquor per week   Drug use: Not on file    Comment: infrequently   Sexual activity: Yes  Partners: Female  Other Topics Concern   Not on file  Social History Narrative   Not on file   Social Determinants of Health   Financial Resource Strain:    Difficulty of Paying Living Expenses:   Food Insecurity:    Worried About Programme researcher, broadcasting/film/video in the Last Year:    Barista in the Last Year:   Transportation Needs:    Freight forwarder (Medical):    Lack of Transportation (Non-Medical):   Physical Activity:    Days of Exercise per Week:    Minutes of Exercise  per Session:   Stress:    Feeling of Stress :   Social Connections:    Frequency of Communication with Friends and Family:    Frequency of Social Gatherings with Friends and Family:    Attends Religious Services:    Active Member of Clubs or Organizations:    Attends Engineer, structural:    Marital Status:   Intimate Partner Violence:    Fear of Current or Ex-Partner:    Emotionally Abused:    Physically Abused:    Sexually Abused:      ROS- All systems are reviewed and negative except as per the HPI above.  Physical Exam: Vitals:   05/13/20 1358  BP: (!) 140/96  Pulse: 80  Weight: 85.1 kg  Height: 5\' 10"  (1.778 m)    GEN- The patient is well appearing, alert and oriented x 3 today.   Head- normocephalic, atraumatic Eyes-  Sclera clear, conjunctiva pink Ears- hearing intact Oropharynx- clear Neck- supple  Lungs- Clear to ausculation bilaterally, normal work of breathing Heart- Regular rate and rhythm, no murmurs, rubs or gallops  GI- soft, NT, ND, + BS Extremities- no clubbing, cyanosis, or edema MS- no significant deformity or atrophy Skin- no rash or lesion Psych- euthymic mood, full affect Neuro- strength and sensation are intact  Wt Readings from Last 3 Encounters:  05/13/20 85.1 kg  04/30/20 88 kg  07/18/19 85.6 kg    EKG today demonstrates typical atrial flutter with V pacing HR 80, QRS 170, QTc 502  Echo 04/28/20 demonstrated  1. Left ventricular ejection fraction, by estimation, is 60 to 65%. The  left ventricle has normal function. Left ventricular endocardial border  not optimally defined to evaluate regional wall motion. Left ventricular  diastolic parameters are  indeterminate.  2. Right ventricular systolic function is moderately reduced. The right  ventricular size is mildly enlarged. Tricuspid regurgitation signal is  inadequate for assessing PA pressure.  3. The mitral valve is abnormal. Trivial mitral valve  regurgitation.  4. The aortic valve was not well visualized. Aortic valve regurgitation  is not visualized.  5. The inferior vena cava is dilated in size with <50% respiratory  variability, suggesting right atrial pressure of 15 mmHg.   Epic records are reviewed at length today  CHA2DS2-VASc Score = 0  The patient's score is based upon: CHF History: 0 HTN History: 0 Age : 0 Diabetes History: 0 Stroke History: 0 Vascular Disease History: 0 Gender: 0      ASSESSMENT AND PLAN: 1. Paroxysmal Atrial Fibrillation/typical atrial flutter The patient's CHA2DS2-VASc score is 0, indicating a 0.2% annual risk of stroke.   General education about afib provided and questions answered. We also discussed his stroke risk. We discussed therapeutic options today including DCCV, AAD, and ablation. Patient would like to take time to consider his options. He does not have insurance. Could consider pacing to  get back in rhythm once he has been on anticoagulation for 3 weeks.  Will start Xarelto 20 mg daily for now. 30 day free card given.  2. Snoring/witnessed apnea/daytime somnolence.  The importance of adequate treatment of sleep apnea was discussed today in order to improve our ability to maintain sinus rhythm long term. Will refer for sleep study. Limited by lack of insurance.   3. CHB S/p PPM, followed by Dr Johney Frame and the device clinic.   Follow up in the AF clinic in 2 weeks.    Jorja Loa PA-C [facility]  [address] [place], [place] [address] [phone] 05/13/2020 3:12 PM

## 2020-05-13 NOTE — Patient Instructions (Signed)
Start Xarelto 20mg - take one tablet by mouth daily Wait to hear from sleep center for appointment to be scheduled.

## 2020-05-13 NOTE — Patient Instructions (Signed)
You will receive a call to set up an appointment with the Atrial Fibrillation Clinic. Call the office if you have palpitations, CP, or dizziness. Go to the ED if your symptoms continue over time.

## 2020-05-13 NOTE — Telephone Encounter (Signed)
Called and spoke with patient, he is aware of appt today 05/13/20 at 2 pm with Jorja Loa, PA.

## 2020-05-14 NOTE — Progress Notes (Signed)
Patient ID: Luis Tupper., male   DOB: 1956-04-05, 64 y.o.   MRN: [id number]  Virtual Visit via Telephone Note  I connected with Luis Flowers. on 05/21/20 at  9:10 AM EDT by telephone and verified that I am speaking with the correct person using two identifiers.   I discussed the limitations, risks, security and privacy concerns of performing an evaluation and management service by telephone and the availability of in person appointments. I also discussed with the patient that there may be a patient responsible charge related to this service. The patient expressed understanding and agreed to proceed.   PATIENT visit by telephone virtually in the context of Covid-19 pandemic. Patient location:  home My Location:  [facility] office Persons on the call:  Me and the patient  History of Present Illness: After hospitalization 7/5-04/30/2020.  Saw cardiology for f/up 05/13/2020 and has another appt next week.  Also has f/up scheduled with Dr. Johney Frame for device check s/p PPM.  Doing well.  No CP.  No SOB.  Weight is stable.  Needs to apply for financial assistance.  Worried about running out of/paying for [employer].  (last cardiology note says for now, Xarelto 20mg  daily)  From discharge summary: Primary Discharge Diagnosis:  Symptomatic bradycardia due to mobitz II second degree AV block status post pacemaker implantation this admission  Secondary Discharge Diagnosis:  Tobacco abuse  No Known Allergies   Procedures This Admission:  1.  Implantation of a St. Jude dual chamber PPM on 04/29/2020 by Dr. 06/30/2020.  The patient received a St. Jude model number Johney Frame PPM with model number [id number] right atrial lead and [id number] right ventricular lead. There were no immediate post procedure complications. 2.  CXR on 04/30/20 demonstrated no pneumothorax status post device implantation.   Brief HPI: Luis Cobbins. is a 64 y.o. male was admitted for lightheadedness and fatigue and found to have  advanced heart block with rates in the 20-40s at times. Electrophysiology team asked to see for consideration of PPM implantation.  Past medical history includes above.  The patient has had symptomatic bradycardia without reversible causes identified.  Risks, benefits, and alternatives to PPM implantation were reviewed with the patient who wished to proceed.   Hospital Course:  The patient was admitted and underwent implantation of a St. Jude dual chamber PPM with details as outlined above.  He was monitored on telemetry overnight which demonstrated V pacing in the 60s.  Left chest was without hematoma or ecchymosis.  The device was interrogated and found to be functioning normally.  CXR was obtained and demonstrated no pneumothorax status post device implantation.  Wound care, arm mobility, and restrictions were reviewed with the patient.  The patient was examined and considered stable for discharge to home.    Pt is not on an anticoagulant.   Cardiology A/P form 05/13/2020: ASSESSMENT AND PLAN: 1. Paroxysmal Atrial Fibrillation/typical atrial flutter The patient's CHA2DS2-VASc score is 0, indicating a 0.2% annual risk of stroke.   General education about afib provided and questions answered. We also discussed his stroke risk. We discussed therapeutic options today including DCCV, AAD, and ablation. Patient would like to take time to consider his options. He does not have insurance. Could consider pacing to get back in rhythm once he has been on anticoagulation for 3 weeks.  Will start Xarelto 20 mg daily for now. 30 day free card given.  2. Snoring/witnessed apnea/daytime somnolence.  The importance of adequate treatment of sleep apnea was discussed today  in order to improve our ability to maintain sinus rhythm long term. Will refer for sleep study. Limited by lack of insurance.   3. CHB S/p PPM, followed by Dr Johney Frame and the device clinic.    Observations/Objective:   Assessment and  Plan: 1. Typical atrial flutter/afib [facility]) See cardiology next week;  They may - rivaroxaban (XARELTO) 20 MG TABS tablet; Take one tablet by mouth daily with meal  Dispense: 30 tablet; Refill: 1  2. Complete heart block (HCC) - rivaroxaban (XARELTO) 20 MG TABS tablet; Take one tablet by mouth daily with meal  Dispense: 30 tablet; Refill: 1  3. Sleep apnea, unspecified type Will refer for sleep study if hasn't already been done(it looks as though was already ordered by cardiology)  4. Hospital discharge follow-up Doing well Voices understanding of treatment plan and f/up appts   Follow Up Instructions: 6-8 weeks assign PCP   I discussed the assessment and treatment plan with the patient. The patient was provided an opportunity to ask questions and all were answered. The patient agreed with the plan and demonstrated an understanding of the instructions.   The patient was advised to call back or seek an in-person evaluation if the symptoms worsen or if the condition fails to improve as anticipated.  I provided 13 minutes of non-face-to-face time during this encounter.   Georgian Co, PA-C

## 2020-05-15 LAB — CUP PACEART INCLINIC DEVICE CHECK
Battery Remaining Longevity: 117 mo
Battery Voltage: 3.13 V
Brady Statistic RA Percent Paced: 21 %
Brady Statistic RV Percent Paced: 82 %
Date Time Interrogation Session: 20210720094300
Implantable Lead Implant Date: 20210706
Implantable Lead Implant Date: 20210706
Implantable Lead Location: 753859
Implantable Lead Location: 753860
Implantable Pulse Generator Implant Date: 20210706
Lead Channel Impedance Value: 525 Ohm
Lead Channel Impedance Value: 662.5 Ohm
Lead Channel Pacing Threshold Amplitude: 0.5 V
Lead Channel Pacing Threshold Pulse Width: 0.5 ms
Lead Channel Sensing Intrinsic Amplitude: 2.9 mV
Lead Channel Sensing Intrinsic Amplitude: 7 mV
Lead Channel Setting Pacing Amplitude: 0.75 V
Lead Channel Setting Pacing Amplitude: 3.5 V
Lead Channel Setting Pacing Pulse Width: 0.5 ms
Lead Channel Setting Sensing Sensitivity: 4 mV
Pulse Gen Model: 2272
Pulse Gen Serial Number: 3846005

## 2020-05-15 NOTE — Progress Notes (Signed)
Wound check appointment. Steri-strips removed. Wound without redness or edema. Incision edges approximated, wound well healed. Normal device function. Thresholds, sensing, and impedances consistent with implant measurements. Device programmed at 3.5V/auto capture programmed on for extra safety margin until 3 month visit. Histogram distribution appropriate for patient and level of activity. AT/AF burden 29%, 4 AMS episodes with AF/AFL episode in progress, no OAC. Willl refer to AF clinic per Dr Ladona Ridgel. No high ventricular rates . Patient educated about wound care, arm mobility, lifting restrictions. ROV in 3 months with Dr Johney Frame on 08/04/20. Enrolled in remote follow-up and next remote scheduled for 07/30/20.

## 2020-05-21 ENCOUNTER — Ambulatory Visit: Payer: Self-pay | Attending: Physician Assistant | Admitting: Physician Assistant

## 2020-05-21 ENCOUNTER — Other Ambulatory Visit: Payer: Self-pay

## 2020-05-21 ENCOUNTER — Encounter: Payer: Self-pay | Admitting: Physician Assistant

## 2020-05-21 DIAGNOSIS — Z09 Encounter for follow-up examination after completed treatment for conditions other than malignant neoplasm: Secondary | ICD-10-CM

## 2020-05-21 DIAGNOSIS — G4730 Sleep apnea, unspecified: Secondary | ICD-10-CM

## 2020-05-21 DIAGNOSIS — I442 Atrioventricular block, complete: Secondary | ICD-10-CM

## 2020-05-21 DIAGNOSIS — I483 Typical atrial flutter: Secondary | ICD-10-CM

## 2020-05-21 MED ORDER — RIVAROXABAN 20 MG PO TABS: ORAL_TABLET | ORAL | 1 refills | Status: AC

## 2020-05-21 MED FILL — XARELTO 20 MG TABLET: 20 | 30 days supply | Qty: 30 | Fill #0

## 2020-05-27 ENCOUNTER — Encounter (HOSPITAL_COMMUNITY): Payer: Self-pay

## 2020-05-27 ENCOUNTER — Ambulatory Visit (HOSPITAL_COMMUNITY): Payer: Self-pay | Admitting: Physician Assistant

## 2020-05-28 ENCOUNTER — Ambulatory Visit (HOSPITAL_COMMUNITY)
Admission: RE | Admit: 2020-05-28 | Discharge: 2020-05-28 | Disposition: A | Discharge status: Home / Self Care | Payer: Self-pay | Source: Ambulatory Visit | Attending: Physician Assistant | Admitting: Physician Assistant

## 2020-05-28 ENCOUNTER — Other Ambulatory Visit: Payer: Self-pay

## 2020-05-28 ENCOUNTER — Encounter (HOSPITAL_COMMUNITY): Payer: Self-pay | Admitting: Physician Assistant

## 2020-05-28 VITALS — BP 130/76 | HR 74 | Ht 70.0 in | Wt 191.6 lb

## 2020-05-28 DIAGNOSIS — R0683 Snoring: Secondary | ICD-10-CM | POA: Insufficient documentation

## 2020-05-28 DIAGNOSIS — G4730 Sleep apnea, unspecified: Secondary | ICD-10-CM | POA: Insufficient documentation

## 2020-05-28 DIAGNOSIS — Z950 Presence of cardiac pacemaker: Secondary | ICD-10-CM | POA: Insufficient documentation

## 2020-05-28 DIAGNOSIS — I442 Atrioventricular block, complete: Secondary | ICD-10-CM | POA: Insufficient documentation

## 2020-05-28 DIAGNOSIS — Z79899 Other long term (current) drug therapy: Secondary | ICD-10-CM | POA: Insufficient documentation

## 2020-05-28 DIAGNOSIS — Z7901 Long term (current) use of anticoagulants: Secondary | ICD-10-CM | POA: Insufficient documentation

## 2020-05-28 DIAGNOSIS — G4710 Hypersomnia, unspecified: Secondary | ICD-10-CM | POA: Insufficient documentation

## 2020-05-28 DIAGNOSIS — I483 Typical atrial flutter: Secondary | ICD-10-CM | POA: Insufficient documentation

## 2020-05-28 DIAGNOSIS — Z87891 Personal history of nicotine dependence: Secondary | ICD-10-CM | POA: Insufficient documentation

## 2020-05-28 DIAGNOSIS — I480 Paroxysmal atrial fibrillation: Secondary | ICD-10-CM | POA: Insufficient documentation

## 2020-05-28 NOTE — Progress Notes (Signed)
Primary Care Physician: Patient, No Pcp Per Primary Cardiologist: Dr Wyline Mood Primary Electrophysiologist: Dr Johney Frame Referring Physician: Dr Bartolo [facility] clinic   Luis Flowers. is a 64 y.o. male with a history of tobacco abuse and CHB s/p PPM, and paroxysmal atrial fibrillation who presents for follow up in the [facility]. Patient presented to the ED with 1-2 weeks of lightheadedness, dizziness, fatigue, and SOB. EKG on arrival showed HRs in the 20s with 3:1 AV block vs CHB. Started on dobutamine with no real improvement in rate. He was admitted and underwent PPM implantation on 04/29/20. The patient was initially diagnosed with atrial fibrillation 05/12/20 on his device. He has a CHADS2VASC score of 0.  He does admit to alcohol use, snoring, witnessed apnea, and daytime somnolence.   On follow up today, patient reports he has done well since his last visit. He is back in SR today. He is not aware when he converted. He does admit that he has not been taking his Xarelto consistently and has only taken  "about half" of his doses.   Today, he denies symptoms of palpitations, chest pain, shortness of breath, orthopnea, PND, lower extremity edema, dizziness, presyncope, syncope, bleeding, or neurologic sequela. The patient is tolerating medications without difficulties and is otherwise without complaint today.    Atrial Fibrillation Risk Factors:  he does have symptoms or diagnosis of sleep apnea. he does not have a history of rheumatic fever. he does have a history of alcohol use. The patient does not have a history of early familial atrial fibrillation or other arrhythmias.  he has a BMI of Body mass index is 27.49 kg/m.[name] Filed Weights   05/28/20 1031  Weight: 86.9 kg    Family History  Problem Relation Age of Onset  . Heart failure Mother   . Heart failure Father      Atrial Fibrillation Management history:  Previous antiarrhythmic drugs:  none Previous cardioversions: none Previous ablations: none CHADS2VASC score: 0 Anticoagulation history: Xarelto   Past Medical History:  Diagnosis Date  . Smoker 07/18/2019   pt current smoker 1ppd   Past Surgical History:  Procedure Laterality Date  . IR THORACENTESIS ASP PLEURAL SPACE W/IMG GUIDE  08/29/2019  . PACEMAKER IMPLANT N/A 04/29/2020   Procedure: PACEMAKER IMPLANT;  Surgeon: [name], MD;  Location: [facility] INVASIVE CV LAB;  Service: Cardiovascular;  Laterality: N/A;    Current Outpatient Medications  Medication Sig Dispense Refill  . acetaminophen (TYLENOL) 325 MG tablet Take 1-2 tablets (325-650 mg total) by mouth every 4 (four) hours as needed for mild pain.    . rivaroxaban (XARELTO) 20 MG TABS tablet Take one tablet by mouth daily with meal 30 tablet 1   No current facility-administered medications for this encounter.    No Known Allergies  Social History   Socioeconomic History  . Marital status: Single    Spouse name: Not on file  . Number of children: Not on file  . Years of education: Not on file  . Highest education level: Not on file  Occupational History  . Not on file  Tobacco Use  . Smoking status: Former Smoker    Packs/day: 1.00    Years: 40.00    Pack years: 40.00    Types: Cigarettes  . Smokeless tobacco: Never Used  Vaping Use  . Vaping Use: Never used  Substance and Sexual Activity  . Alcohol use: Yes    Alcohol/week: 11.0 standard drinks    Types:  6 Cans of beer, 5 Shots of liquor per week  . Drug use: Not on file    Comment: infrequently  . Sexual activity: Yes    Partners: Female  Other Topics Concern  . Not on file  Social History Narrative  . Not on file   Social Determinants of Health   Financial Resource Strain:   . Difficulty of Paying Living Expenses:   Food Insecurity:   . Worried About Programme researcher, broadcasting/film/video in the Last Year:   . Barista in the Last Year:   Transportation Needs:   . Automotive engineer (Medical):   [name] Lack of Transportation (Non-Medical):   Physical Activity:   . Days of Exercise per Week:   . Minutes of Exercise per Session:   Stress:   . Feeling of Stress :   Social Connections:   . Frequency of Communication with Friends and Family:   . Frequency of Social Gatherings with Friends and Family:   . Attends Religious Services:   . Active Member of Clubs or Organizations:   . Attends Banker Meetings:   [name] Marital Status:   Intimate Partner Violence:   . Fear of Current or Ex-Partner:   . Emotionally Abused:   [name] Physically Abused:   . Sexually Abused:      ROS- All systems are reviewed and negative except as per the HPI above.  Physical Exam: Vitals:   05/28/20 1031  BP: 130/76  Pulse: 74  Weight: 86.9 kg  Height: 5\' 10"  (1.778 m)    GEN- The patient is well appearing, alert and oriented x 3 today.   HEENT-head normocephalic, atraumatic, sclera clear, conjunctiva pink, hearing intact, trachea midline. Lungs- Clear to ausculation bilaterally, normal work of breathing Heart- Regular rate and rhythm, no murmurs, rubs or gallops  GI- soft, NT, ND, + BS Extremities- no clubbing, cyanosis, or edema MS- no significant deformity or atrophy Skin- no rash or lesion Psych- euthymic mood, full affect Neuro- strength and sensation are intact   Wt Readings from Last 3 Encounters:  05/28/20 86.9 kg  05/13/20 85.1 kg  04/30/20 88 kg    EKG today demonstrates A sense V paced rhythm HR 74, PR 192, QRS 180, QTc 477  Echo 04/28/20 demonstrated  1. Left ventricular ejection fraction, by estimation, is 60 to 65%. The  left ventricle has normal function. Left ventricular endocardial border  not optimally defined to evaluate regional wall motion. Left ventricular  diastolic parameters are  indeterminate.  2. Right ventricular systolic function is moderately reduced. The right  ventricular size is mildly enlarged. Tricuspid regurgitation  signal is  inadequate for assessing PA pressure.  3. The mitral valve is abnormal. Trivial mitral valve regurgitation.  4. The aortic valve was not well visualized. Aortic valve regurgitation  is not visualized.  5. The inferior vena cava is dilated in size with <50% respiratory  variability, suggesting right atrial pressure of 15 mmHg.   Epic records are reviewed at length today  CHA2DS2-VASc Score = 0  The patient's score is based upon: CHF History: 0 HTN History: 0 Age : 0 Diabetes History: 0 Stroke History: 0 Vascular Disease History: 0 Gender: 0      ASSESSMENT AND PLAN: 1. Paroxysmal Atrial Fibrillation/typical atrial flutter The patient's CHA2DS2-VASc score is 0, indicating a 0.2% annual risk of stroke.   Patient converted back to SR spontaneously.  Continue Xarelto 20 mg daily for 4 weeks and then may  stop given low stroke risk. Samples given. Stressed importance of compliance with medication.  Lifestyle modification was discussed and encouraged including alcohol avoidance.   2. Snoring/witnessed apnea/daytime somnolence.  Sleep study deferred 2/2 lack of insurance.  3. CHB S/p PPM, followed by Dr Johney Frame and the device clinic.   Follow up with Dr Johney Frame as scheduled.    Jorja Loa PA-C Afib Clinic [facility]  [address] [place], [place] [address] [phone] 05/28/2020 10:53 AM

## 2020-07-30 ENCOUNTER — Telehealth: Payer: Self-pay | Admitting: Internal Medicine

## 2020-07-30 NOTE — Telephone Encounter (Signed)
Patient states he has moved out of state and will not be making any further appointments.

## 2020-07-30 NOTE — Telephone Encounter (Signed)
Patient moved to [place]. He wants to know if Dr. Johney Frame can refer him to a cardiologist at [facility] in [place], [place]. Please advise.

## 2020-08-04 ENCOUNTER — Encounter: Payer: Self-pay | Admitting: Internal Medicine

## 2020-08-04 NOTE — Telephone Encounter (Signed)
Spoke to the patient and let him know that Dr. Johney Frame doesn't know of any doctors in that area.  Advised to get set up with his new PCP and they should be familiar with the doctors in that area to make a referral.
